# Patient Record
Sex: Female | Born: 1960 | Race: Black or African American | Hispanic: No | Smoking: Never smoker
Health system: Southern US, Community
[De-identification: ages and names within clinical notes are randomized; demographics above are authoritative.]

## PROBLEM LIST (undated history)

## (undated) DIAGNOSIS — L309 Dermatitis, unspecified: Secondary | ICD-10-CM

## (undated) DIAGNOSIS — K429 Umbilical hernia without obstruction or gangrene: Secondary | ICD-10-CM

## (undated) DIAGNOSIS — Z853 Personal history of malignant neoplasm of breast: Secondary | ICD-10-CM

## (undated) DIAGNOSIS — K439 Ventral hernia without obstruction or gangrene: Secondary | ICD-10-CM

## (undated) DIAGNOSIS — F419 Anxiety disorder, unspecified: Secondary | ICD-10-CM

## (undated) DIAGNOSIS — M1990 Unspecified osteoarthritis, unspecified site: Secondary | ICD-10-CM

## (undated) DIAGNOSIS — D869 Sarcoidosis, unspecified: Secondary | ICD-10-CM

## (undated) HISTORY — PX: PORTACATH PLACEMENT: SHX2246

## (undated) HISTORY — DX: Personal history of malignant neoplasm of breast: Z85.3

## (undated) HISTORY — DX: Umbilical hernia without obstruction or gangrene: K42.9

## (undated) HISTORY — DX: Ventral hernia without obstruction or gangrene: K43.9

---

## 1997-09-23 ENCOUNTER — Encounter: Admission: RE | Admit: 1997-09-23 | Discharge: 1997-09-23 | Payer: Self-pay | Admitting: Family Medicine

## 1997-10-06 ENCOUNTER — Encounter: Admission: RE | Admit: 1997-10-06 | Discharge: 1997-10-06 | Payer: Self-pay | Admitting: Family Medicine

## 1997-10-21 ENCOUNTER — Encounter: Admission: RE | Admit: 1997-10-21 | Discharge: 1997-10-21 | Payer: Self-pay | Admitting: Family Medicine

## 1997-11-21 ENCOUNTER — Ambulatory Visit (HOSPITAL_COMMUNITY): Admission: RE | Admit: 1997-11-21 | Discharge: 1997-11-21 | Payer: Self-pay

## 1997-11-21 ENCOUNTER — Encounter: Admission: RE | Admit: 1997-11-21 | Discharge: 1997-11-21 | Payer: Self-pay | Admitting: Family Medicine

## 1997-12-07 ENCOUNTER — Encounter: Admission: RE | Admit: 1997-12-07 | Discharge: 1997-12-07 | Payer: Self-pay | Admitting: Family Medicine

## 1998-01-11 ENCOUNTER — Encounter: Admission: RE | Admit: 1998-01-11 | Discharge: 1998-01-11 | Payer: Self-pay | Admitting: Family Medicine

## 1998-01-27 ENCOUNTER — Encounter: Admission: RE | Admit: 1998-01-27 | Discharge: 1998-01-27 | Payer: Self-pay | Admitting: Family Medicine

## 1998-01-27 ENCOUNTER — Ambulatory Visit (HOSPITAL_COMMUNITY): Admission: RE | Admit: 1998-01-27 | Discharge: 1998-01-27 | Payer: Self-pay

## 1998-02-17 ENCOUNTER — Encounter: Admission: RE | Admit: 1998-02-17 | Discharge: 1998-02-17 | Payer: Self-pay | Admitting: Family Medicine

## 1998-03-07 ENCOUNTER — Encounter: Admission: RE | Admit: 1998-03-07 | Discharge: 1998-03-07 | Payer: Self-pay | Admitting: Family Medicine

## 1998-03-17 ENCOUNTER — Encounter: Admission: RE | Admit: 1998-03-17 | Discharge: 1998-03-17 | Payer: Self-pay | Admitting: Family Medicine

## 1998-03-21 ENCOUNTER — Encounter: Admission: RE | Admit: 1998-03-21 | Discharge: 1998-03-21 | Payer: Self-pay | Admitting: Sports Medicine

## 1998-04-18 ENCOUNTER — Encounter: Admission: RE | Admit: 1998-04-18 | Discharge: 1998-04-18 | Payer: Self-pay | Admitting: Sports Medicine

## 1998-05-03 ENCOUNTER — Encounter: Admission: RE | Admit: 1998-05-03 | Discharge: 1998-05-03 | Payer: Self-pay | Admitting: Family Medicine

## 1998-05-09 ENCOUNTER — Encounter: Admission: RE | Admit: 1998-05-09 | Discharge: 1998-05-09 | Payer: Self-pay | Admitting: Sports Medicine

## 1998-05-12 ENCOUNTER — Inpatient Hospital Stay (HOSPITAL_COMMUNITY): Admission: AD | Admit: 1998-05-12 | Discharge: 1998-05-12 | Payer: Self-pay | Admitting: *Deleted

## 1998-05-12 ENCOUNTER — Encounter: Admission: RE | Admit: 1998-05-12 | Discharge: 1998-05-12 | Payer: Self-pay | Admitting: Sports Medicine

## 1998-05-16 ENCOUNTER — Inpatient Hospital Stay (HOSPITAL_COMMUNITY): Admission: AD | Admit: 1998-05-16 | Discharge: 1998-05-16 | Payer: Self-pay | Admitting: *Deleted

## 1998-05-18 ENCOUNTER — Inpatient Hospital Stay (HOSPITAL_COMMUNITY): Admission: AD | Admit: 1998-05-18 | Discharge: 1998-05-21 | Payer: Self-pay | Admitting: Obstetrics & Gynecology

## 1998-05-19 ENCOUNTER — Encounter: Admission: RE | Admit: 1998-05-19 | Discharge: 1998-05-19 | Payer: Self-pay | Admitting: Family Medicine

## 1998-05-21 ENCOUNTER — Encounter (HOSPITAL_COMMUNITY): Admission: RE | Admit: 1998-05-21 | Discharge: 1998-08-19 | Payer: Self-pay | Admitting: *Deleted

## 1998-05-25 ENCOUNTER — Inpatient Hospital Stay (HOSPITAL_COMMUNITY): Admission: AD | Admit: 1998-05-25 | Discharge: 1998-05-25 | Payer: Self-pay | Admitting: Obstetrics & Gynecology

## 1998-05-28 ENCOUNTER — Inpatient Hospital Stay (HOSPITAL_COMMUNITY): Admission: AD | Admit: 1998-05-28 | Discharge: 1998-06-01 | Payer: Self-pay | Admitting: Obstetrics & Gynecology

## 1998-06-06 ENCOUNTER — Encounter: Admission: RE | Admit: 1998-06-06 | Discharge: 1998-06-06 | Payer: Self-pay | Admitting: Obstetrics & Gynecology

## 1998-07-11 ENCOUNTER — Encounter: Admission: RE | Admit: 1998-07-11 | Discharge: 1998-07-11 | Payer: Self-pay | Admitting: Sports Medicine

## 1998-07-11 ENCOUNTER — Other Ambulatory Visit: Admission: RE | Admit: 1998-07-11 | Discharge: 1998-07-11 | Payer: Self-pay

## 1999-03-08 ENCOUNTER — Encounter: Admission: RE | Admit: 1999-03-08 | Discharge: 1999-03-08 | Payer: Self-pay | Admitting: Family Medicine

## 1999-11-06 ENCOUNTER — Ambulatory Visit (HOSPITAL_COMMUNITY): Admission: RE | Admit: 1999-11-06 | Discharge: 1999-11-06 | Payer: Self-pay | Admitting: Sports Medicine

## 1999-11-06 ENCOUNTER — Encounter: Payer: Self-pay | Admitting: Sports Medicine

## 2000-06-25 ENCOUNTER — Encounter: Admission: RE | Admit: 2000-06-25 | Discharge: 2000-06-25 | Payer: Self-pay | Admitting: Family Medicine

## 2000-10-25 ENCOUNTER — Emergency Department (HOSPITAL_COMMUNITY): Admission: EM | Admit: 2000-10-25 | Discharge: 2000-10-25 | Payer: Self-pay

## 2000-11-14 ENCOUNTER — Encounter: Admission: RE | Admit: 2000-11-14 | Discharge: 2000-11-14 | Payer: Self-pay | Admitting: Family Medicine

## 2001-02-21 ENCOUNTER — Emergency Department (HOSPITAL_COMMUNITY): Admission: EM | Admit: 2001-02-21 | Discharge: 2001-02-21 | Payer: Self-pay | Admitting: Emergency Medicine

## 2001-03-16 ENCOUNTER — Encounter: Admission: RE | Admit: 2001-03-16 | Discharge: 2001-03-16 | Payer: Self-pay | Admitting: Family Medicine

## 2001-04-16 ENCOUNTER — Encounter: Admission: RE | Admit: 2001-04-16 | Discharge: 2001-04-16 | Payer: Self-pay | Admitting: Family Medicine

## 2001-04-16 ENCOUNTER — Other Ambulatory Visit: Admission: RE | Admit: 2001-04-16 | Discharge: 2001-04-16 | Payer: Self-pay | Admitting: Family Medicine

## 2001-04-16 ENCOUNTER — Encounter (INDEPENDENT_AMBULATORY_CARE_PROVIDER_SITE_OTHER): Payer: Self-pay

## 2001-11-05 ENCOUNTER — Encounter: Admission: RE | Admit: 2001-11-05 | Discharge: 2001-11-05 | Payer: Self-pay | Admitting: Family Medicine

## 2002-02-18 ENCOUNTER — Encounter: Admission: RE | Admit: 2002-02-18 | Discharge: 2002-02-18 | Payer: Self-pay | Admitting: Family Medicine

## 2002-04-30 ENCOUNTER — Emergency Department (HOSPITAL_COMMUNITY): Admission: EM | Admit: 2002-04-30 | Discharge: 2002-04-30 | Payer: Self-pay | Admitting: Emergency Medicine

## 2002-04-30 ENCOUNTER — Encounter: Payer: Self-pay | Admitting: Emergency Medicine

## 2002-05-03 ENCOUNTER — Encounter: Admission: RE | Admit: 2002-05-03 | Discharge: 2002-05-03 | Payer: Self-pay | Admitting: Family Medicine

## 2002-05-20 ENCOUNTER — Encounter: Admission: RE | Admit: 2002-05-20 | Discharge: 2002-05-20 | Payer: Self-pay | Admitting: Family Medicine

## 2003-03-03 ENCOUNTER — Encounter: Admission: RE | Admit: 2003-03-03 | Discharge: 2003-03-03 | Payer: Self-pay | Admitting: Family Medicine

## 2003-03-22 ENCOUNTER — Ambulatory Visit (HOSPITAL_COMMUNITY): Admission: RE | Admit: 2003-03-22 | Discharge: 2003-03-22 | Payer: Self-pay | Admitting: Family Medicine

## 2003-03-29 ENCOUNTER — Encounter: Admission: RE | Admit: 2003-03-29 | Discharge: 2003-03-29 | Payer: Self-pay | Admitting: Sports Medicine

## 2003-05-30 ENCOUNTER — Emergency Department (HOSPITAL_COMMUNITY): Admission: AD | Admit: 2003-05-30 | Discharge: 2003-05-30 | Payer: Self-pay | Admitting: Family Medicine

## 2003-08-27 ENCOUNTER — Emergency Department (HOSPITAL_COMMUNITY): Admission: EM | Admit: 2003-08-27 | Discharge: 2003-08-27 | Payer: Self-pay | Admitting: Family Medicine

## 2003-09-22 ENCOUNTER — Other Ambulatory Visit: Admission: RE | Admit: 2003-09-22 | Discharge: 2003-09-22 | Payer: Self-pay | Admitting: Family Medicine

## 2003-09-22 ENCOUNTER — Encounter: Admission: RE | Admit: 2003-09-22 | Discharge: 2003-09-22 | Payer: Self-pay | Admitting: Family Medicine

## 2003-10-25 ENCOUNTER — Encounter: Admission: RE | Admit: 2003-10-25 | Discharge: 2003-10-25 | Payer: Self-pay | Admitting: Family Medicine

## 2003-11-29 ENCOUNTER — Encounter (INDEPENDENT_AMBULATORY_CARE_PROVIDER_SITE_OTHER): Payer: Self-pay | Admitting: *Deleted

## 2003-11-29 ENCOUNTER — Encounter: Admission: RE | Admit: 2003-11-29 | Discharge: 2003-11-29 | Payer: Self-pay | Admitting: Family Medicine

## 2003-11-29 ENCOUNTER — Encounter (INDEPENDENT_AMBULATORY_CARE_PROVIDER_SITE_OTHER): Payer: Self-pay | Admitting: Diagnostic Radiology

## 2003-11-30 ENCOUNTER — Encounter: Admission: RE | Admit: 2003-11-30 | Discharge: 2003-11-30 | Payer: Self-pay | Admitting: Family Medicine

## 2003-12-27 ENCOUNTER — Encounter: Admission: RE | Admit: 2003-12-27 | Discharge: 2003-12-27 | Payer: Self-pay | Admitting: General Surgery

## 2004-01-25 ENCOUNTER — Encounter: Admission: RE | Admit: 2004-01-25 | Discharge: 2004-01-25 | Payer: Self-pay | Admitting: General Surgery

## 2004-01-26 ENCOUNTER — Ambulatory Visit (HOSPITAL_COMMUNITY): Admission: RE | Admit: 2004-01-26 | Discharge: 2004-01-26 | Payer: Self-pay | Admitting: General Surgery

## 2004-01-31 ENCOUNTER — Ambulatory Visit (HOSPITAL_COMMUNITY): Admission: RE | Admit: 2004-01-31 | Discharge: 2004-01-31 | Payer: Self-pay | Admitting: General Surgery

## 2004-01-31 ENCOUNTER — Ambulatory Visit (HOSPITAL_BASED_OUTPATIENT_CLINIC_OR_DEPARTMENT_OTHER): Admission: RE | Admit: 2004-01-31 | Discharge: 2004-01-31 | Payer: Self-pay | Admitting: General Surgery

## 2004-02-01 ENCOUNTER — Ambulatory Visit (HOSPITAL_COMMUNITY): Admission: RE | Admit: 2004-02-01 | Discharge: 2004-02-01 | Payer: Self-pay | Admitting: Oncology

## 2004-02-13 ENCOUNTER — Ambulatory Visit (HOSPITAL_COMMUNITY): Admission: RE | Admit: 2004-02-13 | Discharge: 2004-02-13 | Payer: Self-pay | Admitting: General Surgery

## 2004-02-18 ENCOUNTER — Emergency Department (HOSPITAL_COMMUNITY): Admission: EM | Admit: 2004-02-18 | Discharge: 2004-02-18 | Payer: Self-pay | Admitting: Emergency Medicine

## 2004-03-09 ENCOUNTER — Ambulatory Visit: Payer: Self-pay | Admitting: Oncology

## 2004-04-09 ENCOUNTER — Encounter: Admission: RE | Admit: 2004-04-09 | Discharge: 2004-04-09 | Payer: Self-pay | Admitting: Oncology

## 2004-05-04 ENCOUNTER — Ambulatory Visit: Payer: Self-pay | Admitting: Oncology

## 2004-06-18 ENCOUNTER — Encounter: Admission: RE | Admit: 2004-06-18 | Discharge: 2004-06-18 | Payer: Self-pay | Admitting: General Surgery

## 2004-06-21 ENCOUNTER — Ambulatory Visit: Payer: Self-pay | Admitting: Oncology

## 2004-08-21 ENCOUNTER — Ambulatory Visit: Payer: Self-pay | Admitting: Oncology

## 2004-09-13 ENCOUNTER — Other Ambulatory Visit: Admission: RE | Admit: 2004-09-13 | Discharge: 2004-09-13 | Payer: Self-pay | Admitting: Gynecology

## 2005-02-19 ENCOUNTER — Encounter: Admission: RE | Admit: 2005-02-19 | Discharge: 2005-02-19 | Payer: Self-pay | Admitting: Oncology

## 2005-06-26 IMAGING — CT CT ABDOMEN W/ CM
1 of 4 series · 13 of 32 positions shown, 18 images · IV contrast (agent unspecified)
Comparison: none

Clinical: Breast carcinoma

CT HEAD WITHOUT AND WITH CONTRAST

[Series 2: cap w/iv 5.0 b30f · axial · 0.74mm/px · z∈[-610,-90]mm · 13 of 118 slices shown, 18 images]
[im 7/118  soft-tissue]
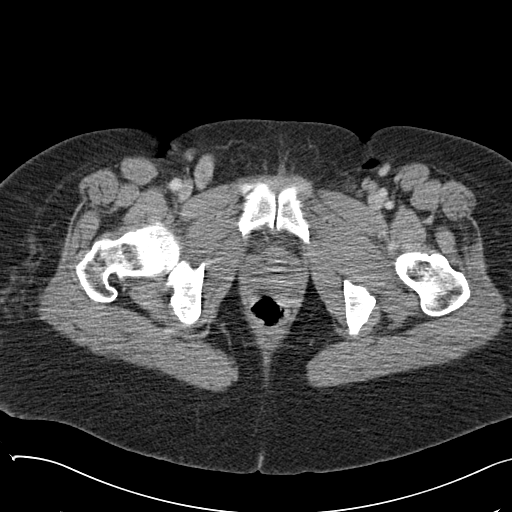
[im 7/118  bone]
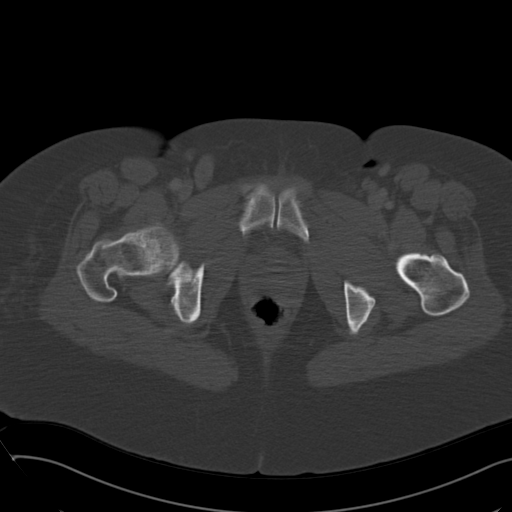
[im 21/118  soft-tissue]
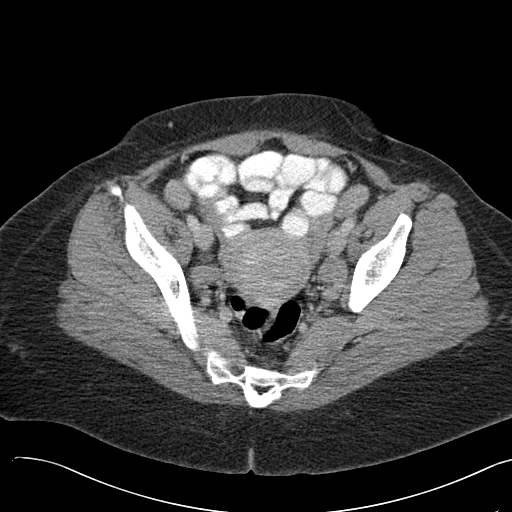
[im 28/118  soft-tissue]
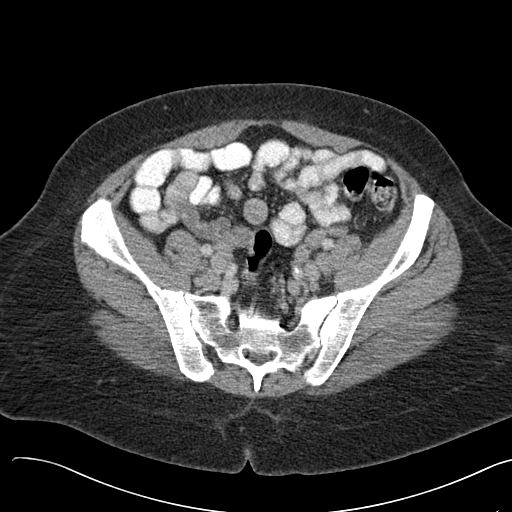
[im 35/118  soft-tissue]
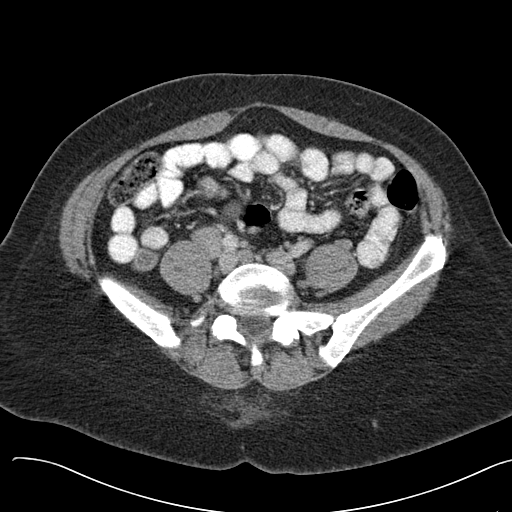
[im 49/118  soft-tissue]
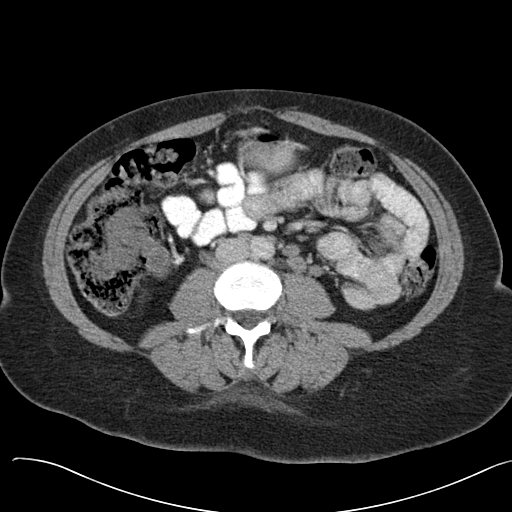
[im 56/118  soft-tissue]
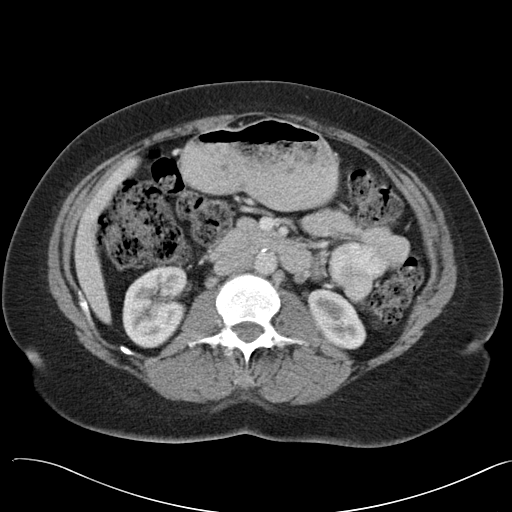
[im 62/118  soft-tissue]
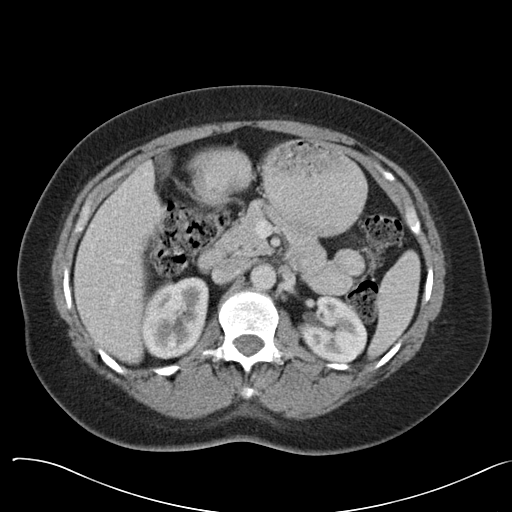
[im 76/118  soft-tissue]
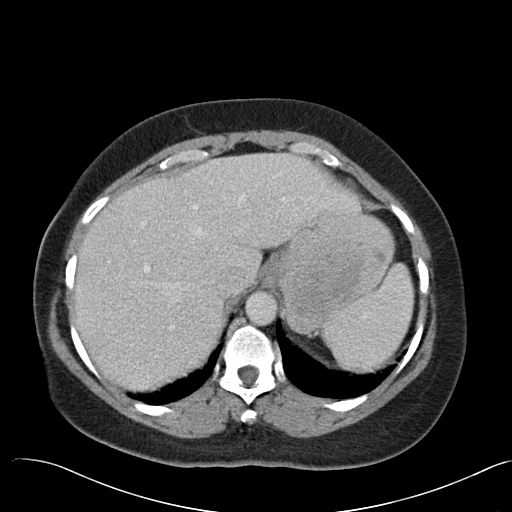
[im 83/118  soft-tissue]
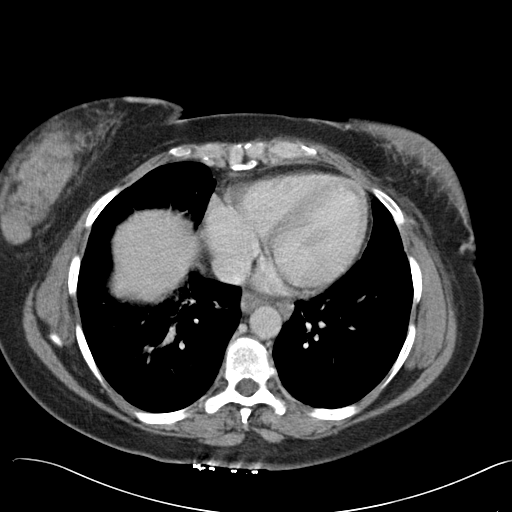
[im 83/118  bone]
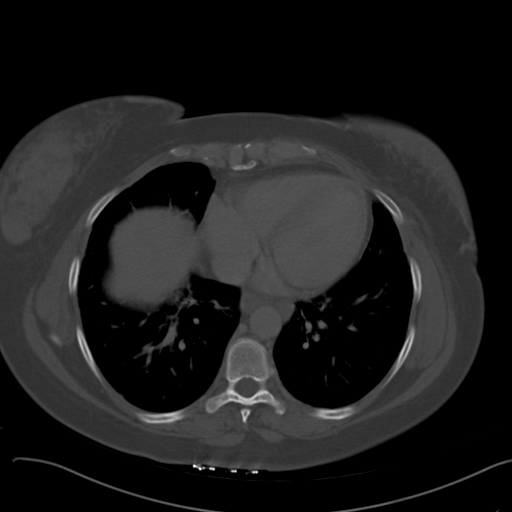
[im 90/118  soft-tissue]
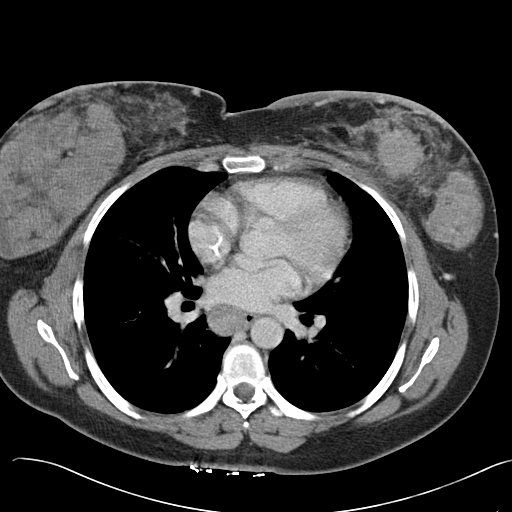
[im 90/118  lung]
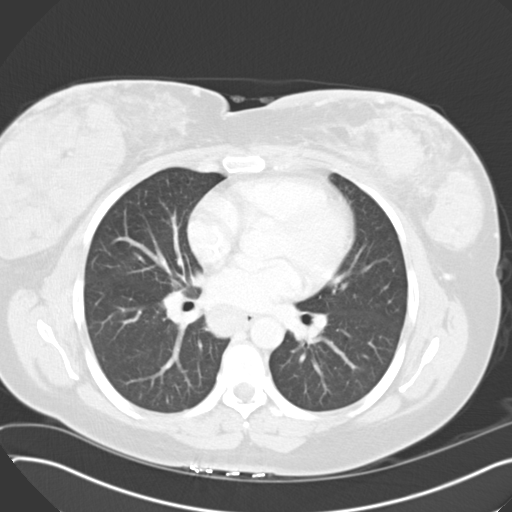
[im 97/118  lung]
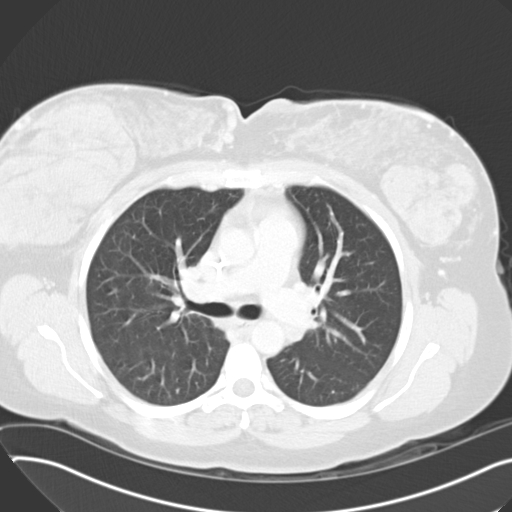
[im 104/118  soft-tissue]
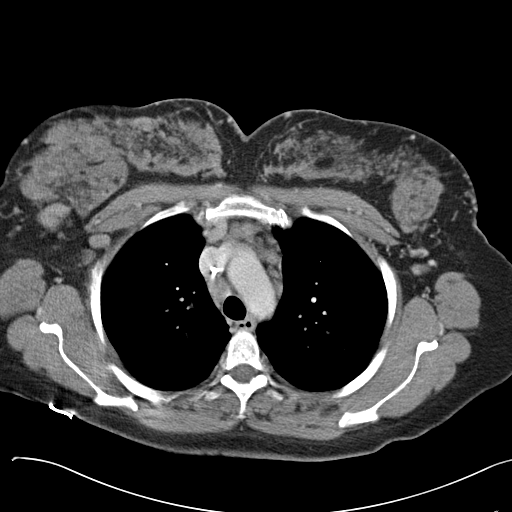
[im 104/118  lung]
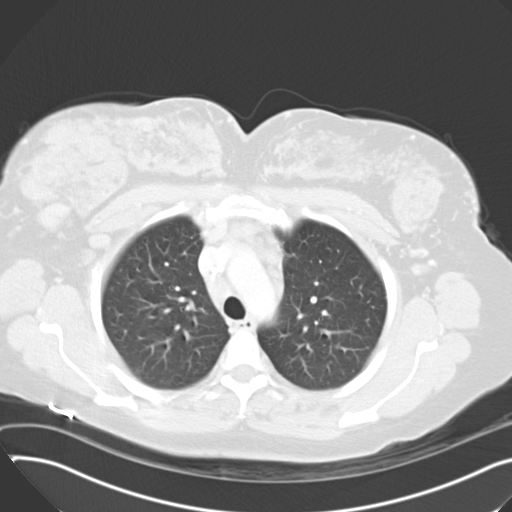
[im 111/118  soft-tissue]
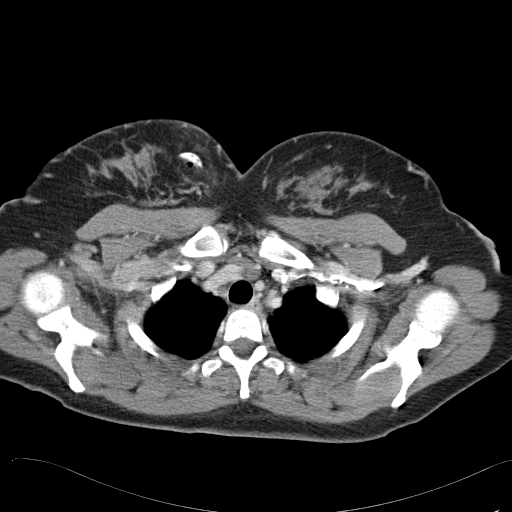
[im 111/118  lung]
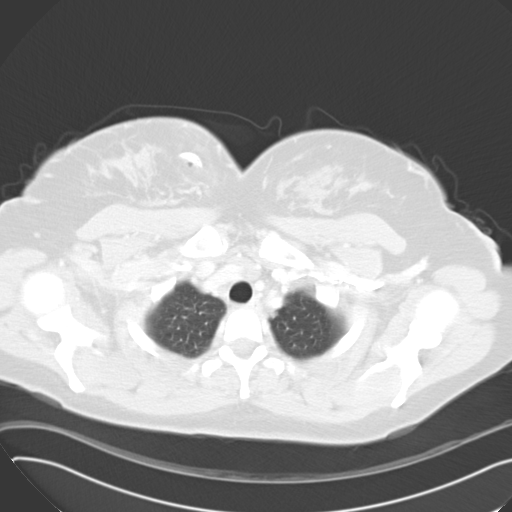

[13 of 32 positions shown; findings below may reference images not displayed]

FINDINGS: Axial scanning before and after 150 ml Imnipaque-KKK IV. No previous for comparison.
Negative for acute intracranial hemorrhage, midline shift, focal parenchymal edema, or mass-effect.
Physiologic calcifications are noted in the basal ganglia. No areas of unexpected enhancement after
IV contrast administration. Bone windows to Mr. no focal lesion.
IMPRESSION: Unremarkable CT head.

CT CHEST WITH CONTRAST
FINDINGS: Right subclavian port catheter extends to the low SVC. There are enlarged axillary lymph
nodes bilaterally right greater than left. Enlarged prevascular, precarinal, pretracheal, and
subcarinal lymph nodes. Largest node is in the subcarinal region measuring 3 cm in short axis
diameter. There is borderline hilar adenopathy bilaterally. No pleural or pericardial effusion.
Lung windows demonstrate a 5 mm noncalcified nonspecific lesion in the superior segment left lower
lobe image 25.
IMPRESSION: 1. Bulky mediastinal and borderline bilateral hilar lymphadenopathy as well as bilateral axillary
adenopathy right greater than left.
2. Nonspecific 5 mm nodule in superior segment left lower lobe.

CT ABDOMEN WITH CONTRAST
FINDINGS: Unremarkable liver, gallbladder, spleen, adrenal glands, pancreas, kidneys. Small bowel
decompressed. No free air. No ascites. Left periaortic adenopathy, largest node measuring 11 mm in
short axis diameter image 67.
IMPRESSION: Left periaortic adenopathy

CT PELVIS WITH CONTRAST
FINDINGS: Normal appendix. Moderate amount of stool throughout the colon, which is nondilated. No
free fluid. Urinary bladder incompletely distended. Uterus and adnexal regions unremarkable. Bulky
bilateral external iliac lymphadenopathy, and bilateral common iliac chain lymphadenopathy. There
is a poorly marginated  nonspecific sclerotic focus in the left sacral ala.
IMPRESSION: 1. Bulky bilateral iliac lymphadenopathy.
2. Nonspecific sclerotic focus in the left sacral ala. This is thought to likely be benign as there
is no evidence of abnormal abnormal activity on recent bone scintigraphy.

## 2005-07-13 IMAGING — CR DG CHEST 2V
2 series · 2 of 2 positions shown · non-contrast
Comparison: none

CLINICAL DATA: Chest pain/fever/breast ca.
 TWO VIEW CHEST 
 Two view chest with comparison 01/25/04.  
 Heart normal.  Still some fullness in the mediastinum consistent with adenopathy although it may be slightly improved.  No definite air space disease or pleural fluid.  Port-a-cath unchanged. 
 IMPRESSION
 1.  Mediastinal adenopathy ? perhaps slightly improved.
 2.  No definite air space disease or pleural fluid.

[view not recorded (1 of 2)]
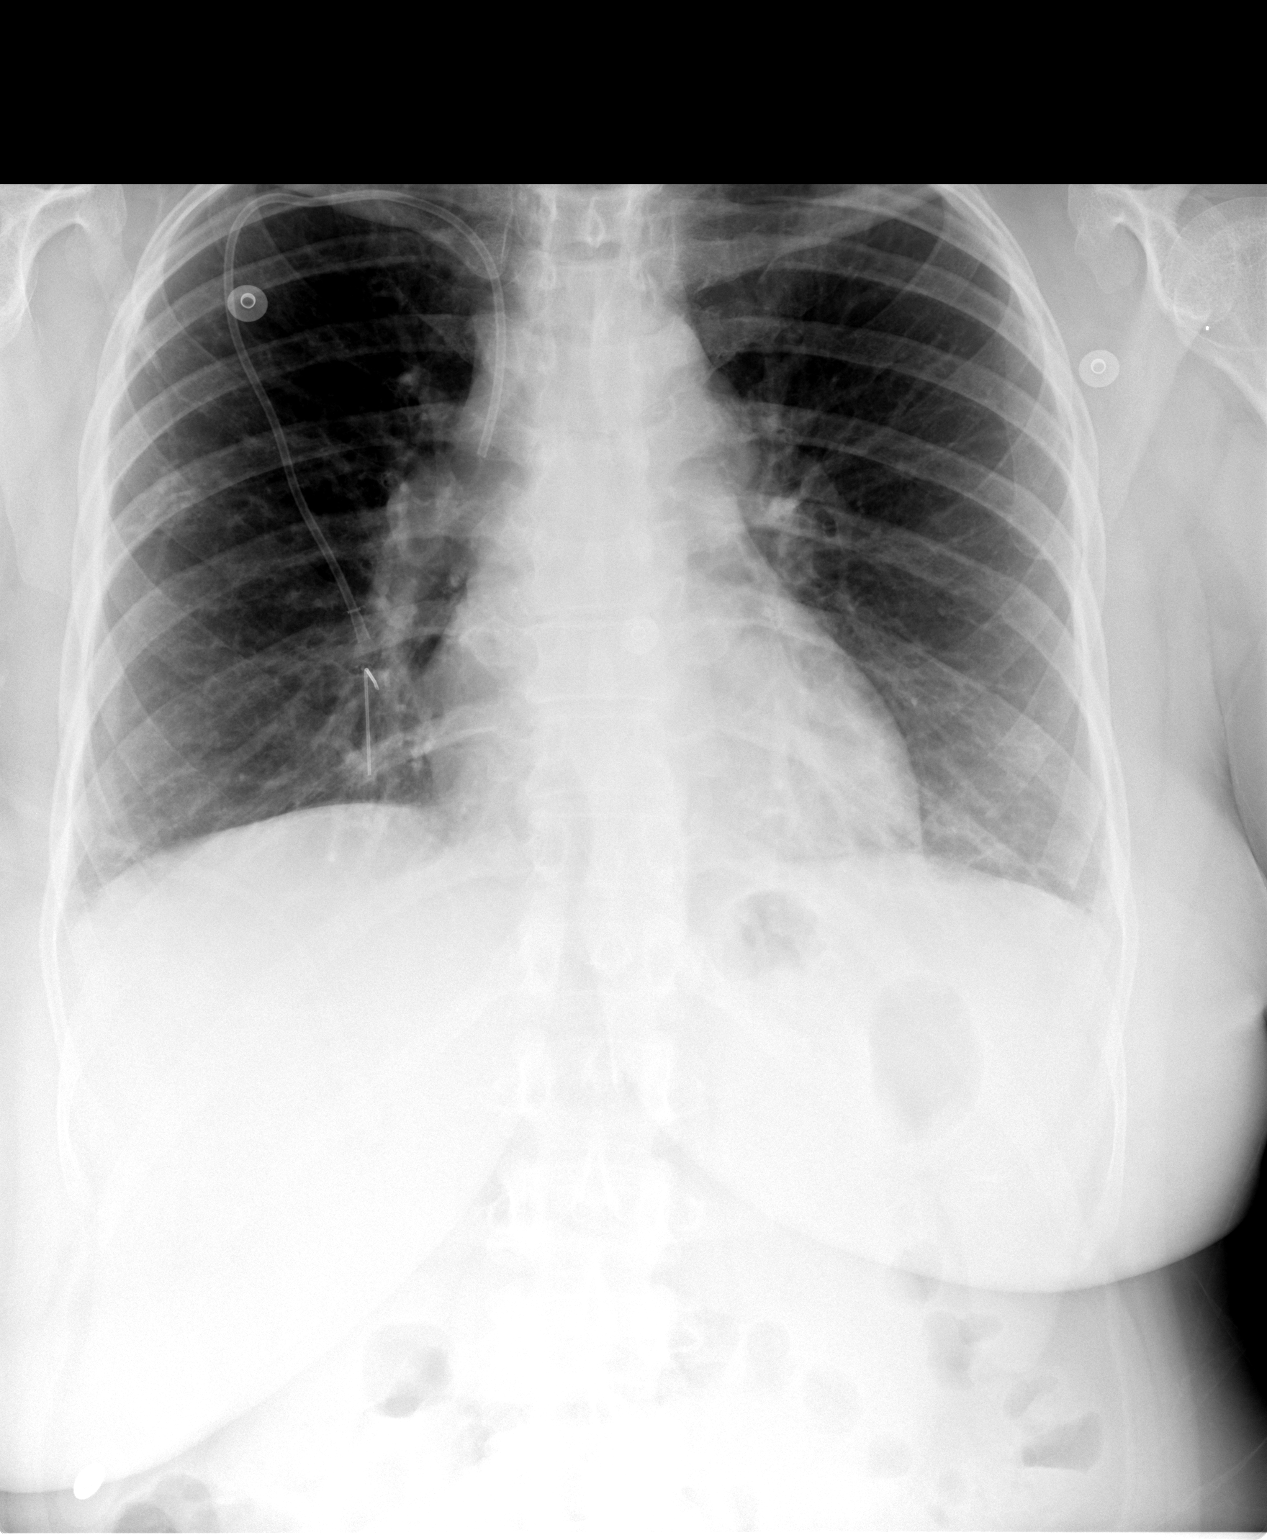

[view not recorded (2 of 2)]
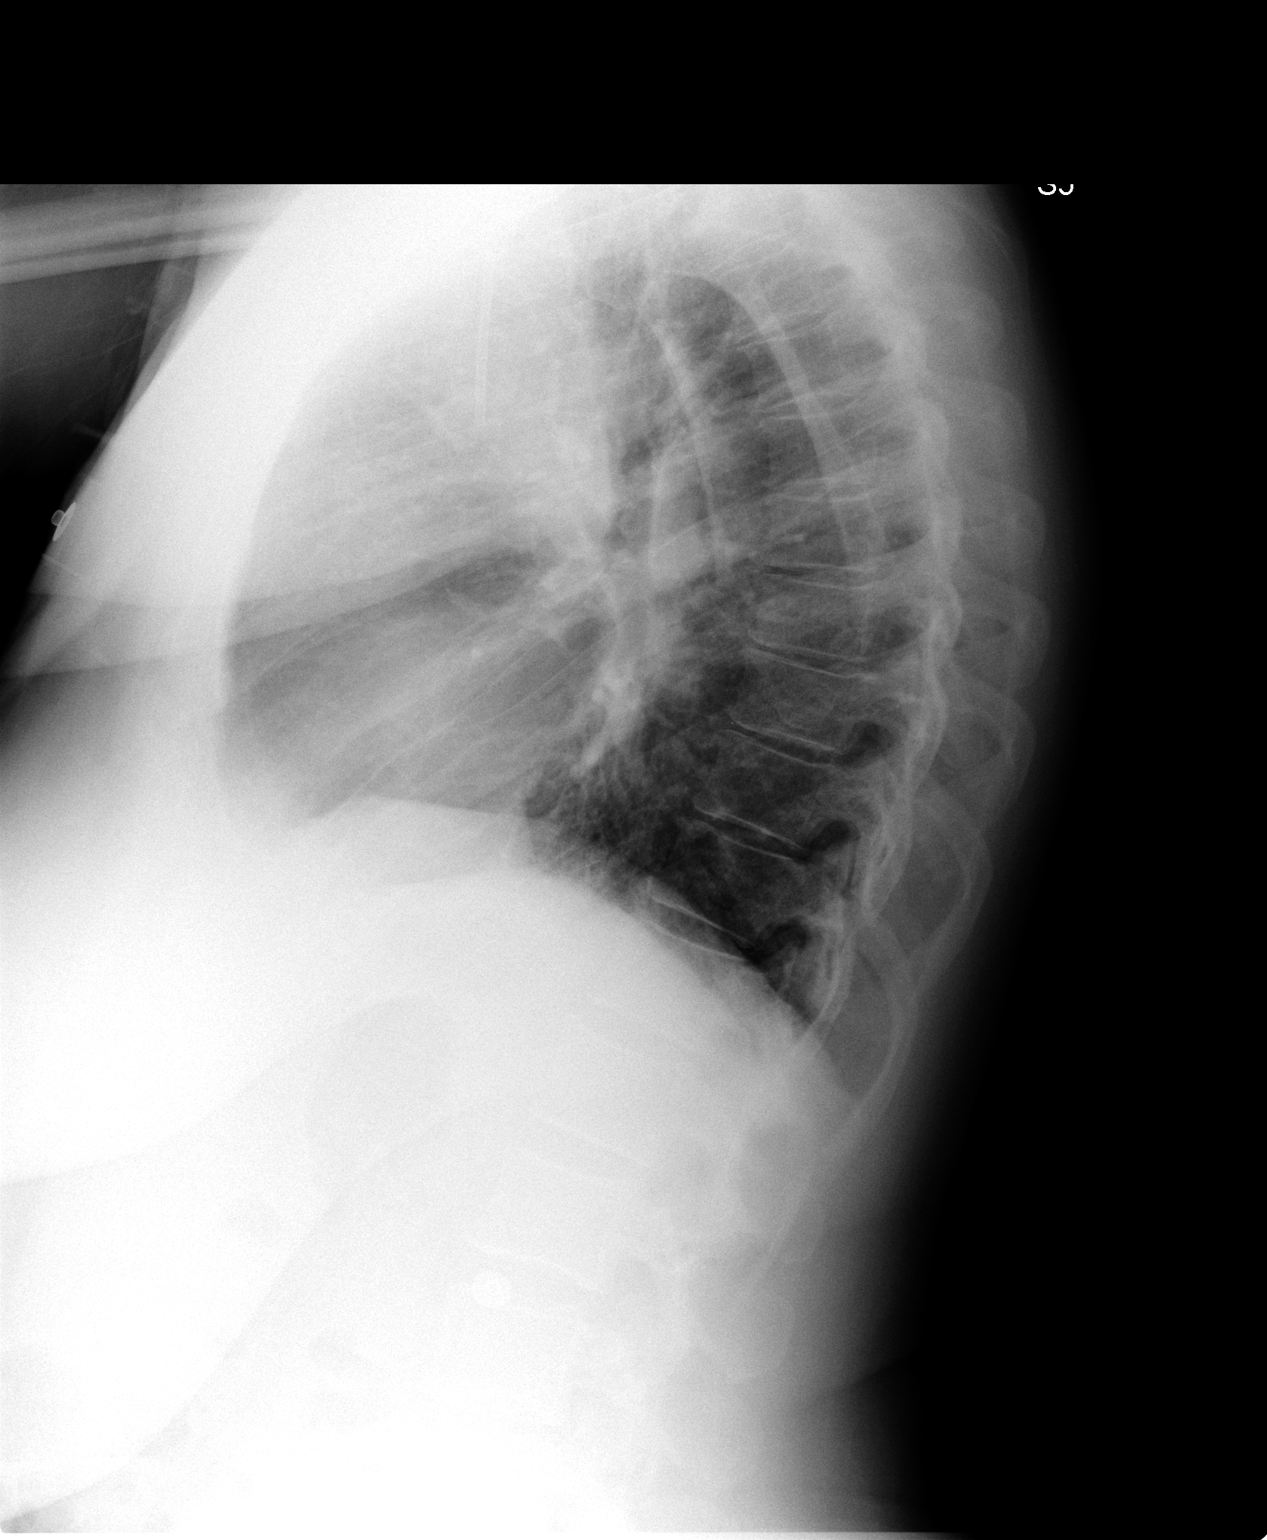

[2 of 2 positions shown; findings below may reference images not displayed]

## 2005-08-30 ENCOUNTER — Ambulatory Visit: Payer: Self-pay | Admitting: Oncology

## 2005-09-02 LAB — COMPREHENSIVE METABOLIC PANEL
ALT: 8 U/L (ref 0–40)
Alkaline Phosphatase: 90 U/L (ref 39–117)
CO2: 29 mEq/L (ref 19–32)
Potassium: 4.3 mEq/L (ref 3.5–5.3)
Sodium: 141 mEq/L (ref 135–145)
Total Bilirubin: 0.6 mg/dL (ref 0.3–1.2)
Total Protein: 7.4 g/dL (ref 6.0–8.3)

## 2005-09-02 LAB — CBC WITH DIFFERENTIAL/PLATELET
BASO%: 0.2 % (ref 0.0–2.0)
LYMPH%: 18.4 % (ref 14.0–48.0)
MCHC: 33.6 g/dL (ref 32.0–36.0)
MONO#: 0.7 10*3/uL (ref 0.1–0.9)
MONO%: 9.8 % (ref 0.0–13.0)
Platelets: 260 10*3/uL (ref 145–400)
RBC: 5.91 10*6/uL — ABNORMAL HIGH (ref 3.70–5.32)
RDW: 15.4 % — ABNORMAL HIGH (ref 11.3–14.5)
WBC: 6.9 10*3/uL (ref 3.9–10.0)

## 2005-09-02 LAB — CANCER ANTIGEN 27.29: CA 27.29: 48 U/mL — ABNORMAL HIGH (ref 0–39)

## 2005-09-12 ENCOUNTER — Emergency Department (HOSPITAL_COMMUNITY): Admission: EM | Admit: 2005-09-12 | Discharge: 2005-09-12 | Payer: Self-pay | Admitting: Family Medicine

## 2005-10-29 ENCOUNTER — Ambulatory Visit: Payer: Self-pay | Admitting: Oncology

## 2005-12-05 ENCOUNTER — Ambulatory Visit: Payer: Self-pay | Admitting: Oncology

## 2005-12-05 LAB — CBC WITH DIFFERENTIAL/PLATELET
BASO%: 0.4 % (ref 0.0–2.0)
Basophils Absolute: 0 10*3/uL (ref 0.0–0.1)
EOS%: 6.4 % (ref 0.0–7.0)
HCT: 39.6 % (ref 34.8–46.6)
MCH: 24.9 pg — ABNORMAL LOW (ref 26.0–34.0)
MCHC: 33.5 g/dL (ref 32.0–36.0)
MCV: 74.2 fL — ABNORMAL LOW (ref 81.0–101.0)
MONO%: 11.8 % (ref 0.0–13.0)
NEUT%: 63.1 % (ref 39.6–76.8)
lymph#: 1 10*3/uL (ref 0.9–3.3)

## 2005-12-05 LAB — COMPREHENSIVE METABOLIC PANEL
Albumin: 3.9 g/dL (ref 3.5–5.2)
Alkaline Phosphatase: 96 U/L (ref 39–117)
BUN: 14 mg/dL (ref 6–23)
CO2: 29 mEq/L (ref 19–32)
Calcium: 9.1 mg/dL (ref 8.4–10.5)
Chloride: 103 mEq/L (ref 96–112)
Glucose, Bld: 110 mg/dL — ABNORMAL HIGH (ref 70–99)
Potassium: 4.3 mEq/L (ref 3.5–5.3)
Sodium: 141 mEq/L (ref 135–145)
Total Protein: 7.1 g/dL (ref 6.0–8.3)

## 2006-03-04 ENCOUNTER — Ambulatory Visit: Payer: Self-pay | Admitting: Oncology

## 2006-05-13 ENCOUNTER — Ambulatory Visit: Payer: Self-pay | Admitting: Oncology

## 2006-05-16 LAB — COMPREHENSIVE METABOLIC PANEL
ALT: 12 U/L (ref 0–35)
CO2: 26 mEq/L (ref 19–32)
Chloride: 105 mEq/L (ref 96–112)
Potassium: 3.5 mEq/L (ref 3.5–5.3)
Sodium: 138 mEq/L (ref 135–145)
Total Bilirubin: 0.7 mg/dL (ref 0.3–1.2)
Total Protein: 7.5 g/dL (ref 6.0–8.3)

## 2006-05-16 LAB — CBC WITH DIFFERENTIAL/PLATELET
BASO%: 0.3 % (ref 0.0–2.0)
MCHC: 32.5 g/dL (ref 32.0–36.0)
MONO#: 0.7 10*3/uL (ref 0.1–0.9)
RBC: 5.81 10*6/uL — ABNORMAL HIGH (ref 3.70–5.32)
RDW: 15.5 % — ABNORMAL HIGH (ref 11.3–14.5)
WBC: 8 10*3/uL (ref 3.9–10.0)
lymph#: 1.2 10*3/uL (ref 0.9–3.3)

## 2006-05-16 LAB — LACTATE DEHYDROGENASE: LDH: 241 U/L (ref 94–250)

## 2006-06-10 ENCOUNTER — Ambulatory Visit (HOSPITAL_BASED_OUTPATIENT_CLINIC_OR_DEPARTMENT_OTHER): Admission: RE | Admit: 2006-06-10 | Discharge: 2006-06-10 | Payer: Self-pay | Admitting: General Surgery

## 2006-07-01 ENCOUNTER — Ambulatory Visit: Payer: Self-pay | Admitting: Oncology

## 2006-07-03 DIAGNOSIS — L2089 Other atopic dermatitis: Secondary | ICD-10-CM

## 2006-07-03 DIAGNOSIS — F410 Panic disorder [episodic paroxysmal anxiety] without agoraphobia: Secondary | ICD-10-CM

## 2006-10-05 ENCOUNTER — Emergency Department (HOSPITAL_COMMUNITY): Admission: EM | Admit: 2006-10-05 | Discharge: 2006-10-05 | Payer: Self-pay | Admitting: Emergency Medicine

## 2006-10-19 ENCOUNTER — Encounter: Admission: RE | Admit: 2006-10-19 | Discharge: 2006-10-19 | Payer: Self-pay | Admitting: Neurosurgery

## 2006-10-22 ENCOUNTER — Ambulatory Visit: Payer: Self-pay | Admitting: Oncology

## 2007-01-16 ENCOUNTER — Encounter: Admission: RE | Admit: 2007-01-16 | Discharge: 2007-01-16 | Payer: Self-pay | Admitting: Oncology

## 2007-01-20 ENCOUNTER — Ambulatory Visit: Payer: Self-pay | Admitting: Oncology

## 2007-02-08 ENCOUNTER — Emergency Department (HOSPITAL_COMMUNITY): Admission: EM | Admit: 2007-02-08 | Discharge: 2007-02-08 | Payer: Self-pay | Admitting: Emergency Medicine

## 2007-02-14 ENCOUNTER — Emergency Department (HOSPITAL_COMMUNITY): Admission: EM | Admit: 2007-02-14 | Discharge: 2007-02-14 | Payer: Self-pay | Admitting: Emergency Medicine

## 2007-02-23 ENCOUNTER — Ambulatory Visit: Payer: Self-pay | Admitting: Oncology

## 2007-02-23 LAB — CEA: CEA: <0.5 ng/mL (ref 0.0–5.0)

## 2007-02-23 LAB — COMPREHENSIVE METABOLIC PANEL
ALT: 10 U/L (ref 0–35)
CO2: 27 mEq/L (ref 19–32)
Calcium: 9.2 mg/dL (ref 8.4–10.5)
Chloride: 104 mEq/L (ref 96–112)
Creatinine, Ser: 1.02 mg/dL (ref 0.40–1.20)
Glucose, Bld: 104 mg/dL — ABNORMAL HIGH (ref 70–99)

## 2007-02-23 LAB — CBC WITH DIFFERENTIAL/PLATELET
Basophils Absolute: 0 10*3/uL (ref 0.0–0.1)
EOS%: 7.8 % — ABNORMAL HIGH (ref 0.0–7.0)
HGB: 13.5 g/dL (ref 11.6–15.9)
MCH: 24.7 pg — ABNORMAL LOW (ref 26.0–34.0)
MCV: 72.7 fL — ABNORMAL LOW (ref 81.0–101.0)
MONO%: 9 % (ref 0.0–13.0)
NEUT%: 65 % (ref 39.6–76.8)
RDW: 15.7 % — ABNORMAL HIGH (ref 11.3–14.5)

## 2007-02-23 LAB — LACTATE DEHYDROGENASE: LDH: 199 U/L (ref 94–250)

## 2007-02-25 ENCOUNTER — Ambulatory Visit (HOSPITAL_COMMUNITY): Admission: RE | Admit: 2007-02-25 | Discharge: 2007-02-25 | Payer: Self-pay | Admitting: Oncology

## 2007-05-04 ENCOUNTER — Ambulatory Visit: Payer: Self-pay | Admitting: Oncology

## 2007-06-17 ENCOUNTER — Ambulatory Visit: Payer: Self-pay | Admitting: Oncology

## 2007-07-23 LAB — CBC WITH DIFFERENTIAL/PLATELET
BASO%: 0.2 % (ref 0.0–2.0)
EOS%: 4.1 % (ref 0.0–7.0)
MCHC: 33.8 g/dL (ref 32.0–36.0)
MONO#: 0.5 10*3/uL (ref 0.1–0.9)
RBC: 5.53 10*6/uL — ABNORMAL HIGH (ref 3.70–5.32)
RDW: 15.4 % — ABNORMAL HIGH (ref 11.3–14.5)
WBC: 7.1 10*3/uL (ref 3.9–10.0)
lymph#: 1.1 10*3/uL (ref 0.9–3.3)

## 2007-07-24 LAB — COMPREHENSIVE METABOLIC PANEL
ALT: 12 U/L (ref 0–35)
AST: 17 U/L (ref 0–37)
CO2: 25 mEq/L (ref 19–32)
Calcium: 8.6 mg/dL (ref 8.4–10.5)
Chloride: 107 mEq/L (ref 96–112)
Sodium: 140 mEq/L (ref 135–145)
Total Bilirubin: 0.5 mg/dL (ref 0.3–1.2)
Total Protein: 7.1 g/dL (ref 6.0–8.3)

## 2007-07-24 LAB — CANCER ANTIGEN 27.29: CA 27.29: 38 U/mL (ref 0–39)

## 2007-07-24 LAB — LACTATE DEHYDROGENASE: LDH: 184 U/L (ref 94–250)

## 2007-07-24 LAB — CEA: CEA: <0.5 ng/mL (ref 0.0–5.0)

## 2007-07-24 LAB — VITAMIN D 25 HYDROXY (VIT D DEFICIENCY, FRACTURES): Vit D, 25-Hydroxy: 10 ng/mL — ABNORMAL LOW (ref 30–89)

## 2007-07-28 LAB — VITAMIN D 1,25 DIHYDROXY: Vit D, 1,25-Dihydroxy: 53 pg/mL (ref 6–62)

## 2007-08-07 ENCOUNTER — Encounter: Admission: RE | Admit: 2007-08-07 | Discharge: 2007-08-07 | Payer: Self-pay | Admitting: Oncology

## 2007-11-17 ENCOUNTER — Ambulatory Visit: Payer: Self-pay | Admitting: Oncology

## 2008-02-19 ENCOUNTER — Ambulatory Visit: Payer: Self-pay | Admitting: Oncology

## 2008-03-09 LAB — CBC WITH DIFFERENTIAL/PLATELET
Basophils Absolute: 0 10*3/uL (ref 0.0–0.1)
EOS%: 7.9 % — ABNORMAL HIGH (ref 0.0–7.0)
HCT: 42.5 % (ref 34.8–46.6)
HGB: 14.1 g/dL (ref 11.6–15.9)
MCH: 24.8 pg — ABNORMAL LOW (ref 26.0–34.0)
NEUT%: 66.7 % (ref 39.6–76.8)
lymph#: 1.1 10*3/uL (ref 0.9–3.3)

## 2008-03-10 LAB — COMPREHENSIVE METABOLIC PANEL
Albumin: 3.8 g/dL (ref 3.5–5.2)
Alkaline Phosphatase: 105 U/L (ref 39–117)
BUN: 19 mg/dL (ref 6–23)
CO2: 25 mEq/L (ref 19–32)
Calcium: 8.7 mg/dL (ref 8.4–10.5)
Chloride: 106 mEq/L (ref 96–112)
Glucose, Bld: 101 mg/dL — ABNORMAL HIGH (ref 70–99)
Potassium: 4.1 mEq/L (ref 3.5–5.3)

## 2008-03-10 LAB — LACTATE DEHYDROGENASE: LDH: 176 U/L (ref 94–250)

## 2008-03-10 LAB — VITAMIN D 25 HYDROXY (VIT D DEFICIENCY, FRACTURES): Vit D, 25-Hydroxy: 20 ng/mL — ABNORMAL LOW (ref 30–89)

## 2008-07-27 ENCOUNTER — Encounter: Admission: RE | Admit: 2008-07-27 | Discharge: 2008-07-27 | Payer: Self-pay | Admitting: Family Medicine

## 2008-08-08 ENCOUNTER — Encounter: Admission: RE | Admit: 2008-08-08 | Discharge: 2008-08-08 | Payer: Self-pay | Admitting: Oncology

## 2008-08-26 ENCOUNTER — Ambulatory Visit: Payer: Self-pay | Admitting: Oncology

## 2008-10-19 ENCOUNTER — Emergency Department (HOSPITAL_COMMUNITY): Admission: EM | Admit: 2008-10-19 | Discharge: 2008-10-19 | Payer: Self-pay | Admitting: Family Medicine

## 2009-03-04 ENCOUNTER — Emergency Department (HOSPITAL_COMMUNITY): Admission: EM | Admit: 2009-03-04 | Discharge: 2009-03-04 | Payer: Self-pay | Admitting: Emergency Medicine

## 2009-06-01 ENCOUNTER — Ambulatory Visit: Payer: Self-pay | Admitting: Oncology

## 2009-06-05 LAB — COMPREHENSIVE METABOLIC PANEL
ALT: 10 U/L (ref 0–35)
AST: 12 U/L (ref 0–37)
Albumin: 3.8 g/dL (ref 3.5–5.2)
Alkaline Phosphatase: 88 U/L (ref 39–117)
Potassium: 4.2 mEq/L (ref 3.5–5.3)
Sodium: 138 mEq/L (ref 135–145)
Total Protein: 7.1 g/dL (ref 6.0–8.3)

## 2009-06-05 LAB — CBC WITH DIFFERENTIAL/PLATELET
EOS%: 5 % (ref 0.0–7.0)
MCH: 24.7 pg — ABNORMAL LOW (ref 25.1–34.0)
MCV: 74.5 fL — ABNORMAL LOW (ref 79.5–101.0)
MONO%: 8.2 % (ref 0.0–14.0)
NEUT#: 4.8 10*3/uL (ref 1.5–6.5)
RBC: 5.71 10*6/uL — ABNORMAL HIGH (ref 3.70–5.45)
RDW: 15.1 % — ABNORMAL HIGH (ref 11.2–14.5)

## 2009-06-05 LAB — VITAMIN D 25 HYDROXY (VIT D DEFICIENCY, FRACTURES): Vit D, 25-Hydroxy: 44 ng/mL (ref 30–89)

## 2009-06-23 ENCOUNTER — Encounter: Admission: RE | Admit: 2009-06-23 | Discharge: 2009-06-23 | Payer: Self-pay | Admitting: Oncology

## 2009-11-28 ENCOUNTER — Ambulatory Visit: Payer: Self-pay | Admitting: Oncology

## 2010-03-16 ENCOUNTER — Encounter (HOSPITAL_BASED_OUTPATIENT_CLINIC_OR_DEPARTMENT_OTHER)
Admission: RE | Admit: 2010-03-16 | Discharge: 2010-04-26 | Payer: Self-pay | Source: Home / Self Care | Attending: General Surgery | Admitting: General Surgery

## 2010-05-21 ENCOUNTER — Emergency Department (HOSPITAL_COMMUNITY)
Admission: EM | Admit: 2010-05-21 | Discharge: 2010-05-21 | Payer: Self-pay | Source: Home / Self Care | Admitting: Family Medicine

## 2010-05-26 ENCOUNTER — Encounter: Payer: Self-pay | Admitting: Oncology

## 2010-05-27 ENCOUNTER — Encounter: Payer: Self-pay | Admitting: General Surgery

## 2010-05-27 ENCOUNTER — Encounter: Payer: Self-pay | Admitting: Oncology

## 2010-05-28 ENCOUNTER — Encounter: Payer: Self-pay | Admitting: Oncology

## 2010-06-18 ENCOUNTER — Other Ambulatory Visit: Payer: Self-pay | Admitting: Oncology

## 2010-09-21 NOTE — Op Note (Signed)
NAME:  Alicia Kaufman           ACCOUNT NO.:  [id number]   MEDICAL RECORD NO.:  [id number]          PATIENT TYPE:  AMB   LOCATION:  DSC                          FACILITY:  [facility]   PHYSICIAN:  Rose Phi. Maple Hudson, M.D.   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   OPERATION:  Insertion of Port-A-Cath.   SURGEON:  Rose Phi. Maple Hudson, M.D.   ANESTHESIA:  MAC.   DESCRIPTION OF PROCEDURE:  The patient placed on the operating table and the  right chest wall and neck prepped and draped in the usual fashion.  Under  local anesthesia, right subclavian puncture was carried out without  difficulty and the guidewire inserted and proper positioning of the wire  confirmed by fluoroscopy.   Under local anesthesia, a small incision was made on the anterior chest wall  and a pocket developed for the implantable port.  I then tunneled between  the subclavian puncture site and the newly developed pocket and pulled the  catheter through there and then connected to the port and placed the port in  the proper position in the pocket.  We measured the catheter to go to what  appeared to be the fourth interspace at the cavoatrial junction.  I then  passed the dilator and peel-away sheath over the wire and removed the wire  and the dilator and passed the catheter through the peel-away sheath and  then removed it.   Again, fluoroscopy was used to confirm that the tip was in the superior vena  cava and that there was no kinking in the system.   I then accessed the port and aspirated and then fully heparinized it.   The incisions were closed with 3-0 Vicryl and subcuticular 4-0 Monocryl and  skin glue.  Dressings applied.  The patient transferred to the recovery room  in satisfactory  condition having tolerated the procedure well.      Pete   PRY/MEDQ  D:  01/31/2004  T:  01/31/2004  Job:  [id number]

## 2010-09-21 NOTE — Op Note (Signed)
NAME:  Alicia Kaufman           ACCOUNT NO.:  [id number]   MEDICAL RECORD NO.:  [id number]          PATIENT TYPE:  AMB   LOCATION:  DSC                          FACILITY:  [facility]   PHYSICIAN:  Rose Phi. Maple Hudson, M.D.   DATE OF BIRTH:  1960-11-18   DATE OF PROCEDURE:  06/10/2006  DATE OF DISCHARGE:                               OPERATIVE REPORT   PREOPERATIVE DIAGNOSIS:  Carcinoma left breast.   POSTOPERATIVE DIAGNOSIS:  Carcinoma left breast.   OPERATION:  Removal of Port-A-Cath.   SURGEON:  Rose Phi. Maple Hudson, M.D.   ANESTHESIA:  Local.   OPERATIVE PROCEDURE:  The patient placed on the operating table with the  arms down by side.  The right upper chest over the Port-A-Cath was  prepped and draped in the usual fashion.  After obtaining good local  anesthesia with the 1% Xylocaine with adrenaline, a short transverse  incision was made and the port exposed.  The catheter was grasped then  removed from the subclavian vein with no bleeding.   The two sutures holding it in place were divided and the port removed.   Again with no bleeding a subcuticular closure 4-0 Monocryl and Steri-  Strips carried out.  Dressing applied.  The patient then allowed to go  home.      Rose Phi. Maple Hudson, M.D.  Electronically Signed     PRY/MEDQ  D:  06/10/2006  T:  06/10/2006  Job:  [id number]

## 2010-10-21 ENCOUNTER — Emergency Department (HOSPITAL_COMMUNITY): Payer: Medicaid Other

## 2010-10-21 ENCOUNTER — Emergency Department (HOSPITAL_COMMUNITY)
Admission: EM | Admit: 2010-10-21 | Discharge: 2010-10-21 | Disposition: A | Discharge status: Home / Self Care | Payer: Medicaid Other | Attending: Emergency Medicine | Admitting: Emergency Medicine

## 2010-10-21 DIAGNOSIS — Z853 Personal history of malignant neoplasm of breast: Secondary | ICD-10-CM | POA: Insufficient documentation

## 2010-10-21 DIAGNOSIS — R42 Dizziness and giddiness: Secondary | ICD-10-CM | POA: Insufficient documentation

## 2010-10-21 DIAGNOSIS — E876 Hypokalemia: Secondary | ICD-10-CM | POA: Insufficient documentation

## 2010-10-21 DIAGNOSIS — I498 Other specified cardiac arrhythmias: Secondary | ICD-10-CM | POA: Insufficient documentation

## 2010-10-21 DIAGNOSIS — F411 Generalized anxiety disorder: Secondary | ICD-10-CM | POA: Insufficient documentation

## 2010-10-21 DIAGNOSIS — H538 Other visual disturbances: Secondary | ICD-10-CM | POA: Insufficient documentation

## 2010-10-21 LAB — POCT I-STAT, CHEM 8
BUN: 11 mg/dL (ref 6–23)
Calcium, Ion: 1.13 mmol/L (ref 1.12–1.32)
Chloride: 102 mEq/L (ref 96–112)
Glucose, Bld: 130 mg/dL — ABNORMAL HIGH (ref 70–99)

## 2010-10-22 LAB — POCT PREGNANCY, URINE: Preg Test, Ur: NEGATIVE

## 2011-06-30 ENCOUNTER — Emergency Department (HOSPITAL_COMMUNITY)
Admission: EM | Admit: 2011-06-30 | Discharge: 2011-06-30 | Disposition: A | Discharge status: Home / Self Care | Payer: Medicare Other | Attending: Emergency Medicine | Admitting: Emergency Medicine

## 2011-06-30 ENCOUNTER — Encounter (HOSPITAL_COMMUNITY): Payer: Self-pay | Admitting: Emergency Medicine

## 2011-06-30 DIAGNOSIS — R1033 Periumbilical pain: Secondary | ICD-10-CM

## 2011-06-30 DIAGNOSIS — R109 Unspecified abdominal pain: Secondary | ICD-10-CM | POA: Insufficient documentation

## 2011-06-30 HISTORY — DX: Dermatitis, unspecified: L30.9

## 2011-06-30 LAB — COMPREHENSIVE METABOLIC PANEL
AST: 19 U/L (ref 0–37)
Albumin: 3.8 g/dL (ref 3.5–5.2)
Calcium: 9.2 mg/dL (ref 8.4–10.5)
Chloride: 101 mEq/L (ref 96–112)
Creatinine, Ser: 1.07 mg/dL (ref 0.50–1.10)
Total Bilirubin: 0.4 mg/dL (ref 0.3–1.2)
Total Protein: 8.1 g/dL (ref 6.0–8.3)

## 2011-06-30 LAB — URINALYSIS, ROUTINE W REFLEX MICROSCOPIC
Glucose, UA: NEGATIVE mg/dL
Leukocytes, UA: NEGATIVE
Specific Gravity, Urine: 1.02 (ref 1.005–1.030)
pH: 6 (ref 5.0–8.0)

## 2011-06-30 LAB — CBC
MCV: 72.2 fL — ABNORMAL LOW (ref 78.0–100.0)
Platelets: 261 10*3/uL (ref 150–400)
RDW: 14.6 % (ref 11.5–15.5)
WBC: 9.3 10*3/uL (ref 4.0–10.5)

## 2011-06-30 LAB — DIFFERENTIAL
Basophils Absolute: 0 10*3/uL (ref 0.0–0.1)
Lymphs Abs: 2 10*3/uL (ref 0.7–4.0)
Monocytes Relative: 8 % (ref 3–12)

## 2011-06-30 LAB — LACTIC ACID, PLASMA: Lactic Acid, Venous: 1.2 mmol/L (ref 0.5–2.2)

## 2011-06-30 MED ORDER — OXYCODONE-ACETAMINOPHEN 5-325 MG PO TABS: 2.0000 | ORAL_TABLET | Freq: Four times a day (QID) | ORAL | Status: DC | PRN

## 2011-06-30 MED ORDER — MORPHINE SULFATE 4 MG/ML IJ SOLN
4.0000 mg | Freq: Once | INTRAMUSCULAR | Status: AC
  Administered 2011-06-30: 4 mg via INTRAVENOUS
  Filled 2011-06-30: qty 1

## 2011-06-30 NOTE — ED Notes (Signed)
Pt c/o peri umbilical pain onset Nov 2012, worse with exercise. Denies n/v/d. Pt states she does have reducable mass after doing sit ups. Pt states pain worse now.

## 2011-06-30 NOTE — ED Provider Notes (Signed)
History     CSN: [id number]  Arrival date & time 06/30/11  1518   First MD Initiated Contact with Patient 06/30/11 1647      Chief Complaint  Patient presents with  . Abdominal Pain    (Consider location/radiation/quality/duration/timing/severity/associated sxs/prior treatment) HPI Patient is a 51 yo F who complains of pain and a "knot: just above the umbilicus that she first noted in October. Her pain correlates with the appearance of this knot which occurs following a large meal and with activities causing valsalva.  She has history of c-section but no other surgeries.  She last had a bowel movement this afternoon and notes no constipation or diarrhea.  She denies fevers, nausea, or vomiting.  She rates her pain as an 8/10 at its most severe and this improves with time and when the bulge in her stomach resolves.  Patient has 6/10 pain currently and does have a palpable knot above the umbilicus consistent with preiumbilical hernia that is not currently reducible.  There are no other associated or modifying factors.  Past Medical History  Diagnosis Date  . Eczema     Past Surgical History  Procedure Date  . Cesarean section     History reviewed. No pertinent family history.  History  Substance Use Topics  . Smoking status: Former Games developer  . Smokeless tobacco: Not on file  . Alcohol Use: Yes     occasional    OB History    Grav Para Term Preterm Abortions TAB SAB Ect Mult Living                  Review of Systems  Constitutional: Negative.   HENT: Negative.   Eyes: Negative.   Respiratory: Negative.   Cardiovascular: Negative.   Gastrointestinal: Positive for abdominal pain.  Genitourinary: Negative.   Musculoskeletal: Negative.   Skin: Negative.   Neurological: Negative.   Hematological: Negative.   Psychiatric/Behavioral: Negative.   All other systems reviewed and are negative.    Allergies  Review of patient's allergies indicates no known  allergies.  Home Medications   Current Outpatient Rx  Name Route Sig Dispense Refill  . OXYCODONE-ACETAMINOPHEN 5-325 MG PO TABS Oral Take 2 tablets by mouth every 6 (six) hours as needed for pain. 15 tablet 0    BP 151/80  Pulse 103  Temp(Src) 98.6 F (37 C) (Oral)  Resp 18  SpO2 97%  LMP 06/30/2011  Physical Exam  Nursing note and vitals reviewed. GEN: Well-developed, well-nourished female in no distress HEENT: Atraumatic, normocephalic. Oropharynx clear without erythema EYES: PERRLA BL, no scleral icterus. NECK: Trachea midline, no meningismus CV: regular rate and rhythm. No murmurs, rubs, or gallops PULM: No respiratory distress.  No crackles, wheezes, or rales. GI: soft, patient with palpable bulge consistent with periumbilical hernia palpated superior to the umbilicus and noted to be 2 inches in diameter with TTP and not currently reducible. No guarding, rebound, + bowel sounds  Neuro: cranial nerves 2-12 intact, no abnormalities of strength or sensation, A and O x 3 MSK: Patient moves all 4 extremities symmetrically, no deformity, edema, or injury noted Psych: no abnormality of mood   ED Course  Procedures (including critical care time)  Labs Reviewed  CBC - Abnormal; Notable for the following:    RBC 5.90 (*)    MCV 72.2 (*)    MCH 24.4 (*)    All other components within normal limits  COMPREHENSIVE METABOLIC PANEL - Abnormal; Notable for the following:  GFR calc non Af Amer 59 (*)    GFR calc Af Amer 68 (*)    All other components within normal limits  DIFFERENTIAL  LIPASE, BLOOD  URINALYSIS, ROUTINE W REFLEX MICROSCOPIC  LACTIC ACID, PLASMA   No results found.   1. Abdominal pain   2. Umbilical pain       MDM  Patient was evaluated and had TTP overlying a palpable bulge in the periumbilical region.  Patient was given morphine and ice pack was applied while labs to verify no other causes of her abdominal pain were performed.  A lactate was also  added to confirm no significant evidence for bowel injury.  All labs were WNL.  Patient's hernia reduced and she had resolution of her pain.  She is not obstructed today.  She was referred to [place] [facility] for discussion of possible elective repair given her increasing frequency of symptoms and discomfort.  She was discharged in good condition with a prescription for pain medicine.  She was instructed to take this only when the bulge appears in combination with her pain at home.  If she is unable to reduce her hernia she should lie down, take the medicine, and apply ice as we have done here.  If she is still unable to reduce the hernia she should seek additional medical care.  Other reasons to return including signs of obstruction were reviewed with the patient and she was discharged in good condition.        Cyndra Numbers, MD 07/01/11 [phone]

## 2011-06-30 NOTE — Discharge Instructions (Signed)
When the bulge in your stomach appears you should use ice pack as well as pain medications to try and get this to go back into your stomach. If you cannot make the bolts go back into your stomach you need to be evaluated at an emergency department or any other physician's office. This could indicate that you may need surgery emergently. Abdominal Pain Abdominal pain can be caused by many things. Your caregiver decides the seriousness of your pain by an examination and possibly blood tests and X-rays. Many cases can be observed and treated at home. Most abdominal pain is not caused by a disease and will probably improve without treatment. However, in many cases, more time must pass before a clear cause of the pain can be found. Before that point, it may not be known if you need more testing, or if hospitalization or surgery is needed. HOME CARE INSTRUCTIONS   Do not take laxatives unless directed by your caregiver.   Take pain medicine only as directed by your caregiver.   Only take over-the-counter or prescription medicines for pain, discomfort, or fever as directed by your caregiver.   Try a clear liquid diet (broth, tea, or water) for as long as directed by your caregiver. Slowly move to a bland diet as tolerated.  SEEK IMMEDIATE MEDICAL CARE IF:   The pain does not go away.   You have a fever.   You keep throwing up (vomiting).   The pain is felt only in portions of the abdomen. Pain in the right side could possibly be appendicitis. In an adult, pain in the left lower portion of the abdomen could be colitis or diverticulitis.   You pass bloody or black tarry stools.  MAKE SURE YOU:   Understand these instructions.   Will watch your condition.   Will get help right away if you are not doing well or get worse.  Document Released: 01/30/2005 Document Revised: 01/02/2011 Document Reviewed: 12/09/2007 [facility] Patient Information 2012 [place], [place].

## 2011-07-02 ENCOUNTER — Encounter (INDEPENDENT_AMBULATORY_CARE_PROVIDER_SITE_OTHER): Payer: Medicare Other | Admitting: Surgery

## 2011-07-05 ENCOUNTER — Encounter (INDEPENDENT_AMBULATORY_CARE_PROVIDER_SITE_OTHER): Payer: Self-pay | Admitting: Surgery

## 2011-07-05 ENCOUNTER — Ambulatory Visit (INDEPENDENT_AMBULATORY_CARE_PROVIDER_SITE_OTHER): Payer: Medicare Other | Admitting: Surgery

## 2011-07-05 VITALS — BP 158/96 | HR 84 | Temp 98.2°F | Resp 20 | Ht 67.5 in | Wt 264.0 lb

## 2011-07-05 DIAGNOSIS — Z853 Personal history of malignant neoplasm of breast: Secondary | ICD-10-CM

## 2011-07-05 DIAGNOSIS — K439 Ventral hernia without obstruction or gangrene: Secondary | ICD-10-CM

## 2011-07-05 HISTORY — DX: Ventral hernia without obstruction or gangrene: K43.9

## 2011-07-05 NOTE — Patient Instructions (Signed)
We will schedule outpatient surgery to repair the hernia just above your belly-button

## 2011-07-05 NOTE — Progress Notes (Signed)
Patient ID: Alicia Kaufman, female   DOB: [birth year], 51 y.o.   MRN: [id number]  Chief Complaint  Patient presents with  . Umbilical Hernia    new pt    HPI Alicia Kaufman is a 51 y.o. female.  She was seen and there was a long emergency room for abdominal pain about a week ago. In reviewing the emergency room dose it was apparent that she had a small epigastric hernia that was giving her the pain. He was able to be reduced and she was able to be discharged and comes here to discuss possible surgical intervention.  She thinks that she has had a bulge in that area since October. She noticed it squishes around. She is not currently having pain. HPI  Past Medical History  Diagnosis Date  . Eczema   . Umbilical hernia   . History of breast cancer     left  . Epigastric hernia 07/05/2011    Past Surgical History  Procedure Date  . Cesarean section   . Portacath placement 2005    Family History  Problem Relation Age of Onset  . Depression Mother   . Hypertension Mother   . Cancer Father     stomach  . Diabetes Father     Social History History  Substance Use Topics  . Smoking status: Never Smoker   . Smokeless tobacco: Not on file  . Alcohol Use: No    No Known Allergies  Current Outpatient Prescriptions  Medication Sig Dispense Refill  . Multiple Vitamin (MULTIVITAMIN) capsule Take 1 capsule by mouth daily.        Review of Systems Review of Systems  Constitutional: Negative for fever, chills and unexpected weight change.  HENT: Negative for hearing loss, congestion, sore throat, trouble swallowing and voice change.   Eyes: Negative for visual disturbance.  Respiratory: Negative for cough and wheezing.   Cardiovascular: Negative for chest pain, palpitations and leg swelling.  Gastrointestinal: Positive for abdominal pain. Negative for nausea, vomiting, diarrhea, constipation, blood in stool, abdominal distention and anal bleeding.       Pain related to her hernia   Genitourinary: Negative for hematuria, vaginal bleeding and difficulty urinating.  Musculoskeletal: Negative for arthralgias.  Skin: Negative for rash and wound.  Neurological: Negative for seizures, syncope and headaches.  Hematological: Negative for adenopathy. Does not bruise/bleed easily.  Psychiatric/Behavioral: Negative for confusion.    Blood pressure 158/96, pulse 84, temperature 98.2 F (36.8 C), temperature source Temporal, resp. rate 20, height 5' 7.5" (1.715 m), weight 264 lb (119.75 kg), last menstrual period 06/30/2011.  Physical Exam Physical Exam  Vitals reviewed. Constitutional: She is oriented to person, place, and time. She appears well-developed and well-nourished. No distress.  HENT:  Head: Normocephalic and atraumatic.  Mouth/Throat: Oropharynx is clear and moist.  Eyes: Conjunctivae and EOM are normal. Pupils are equal, round, and reactive to light. No scleral icterus.  Neck: Normal range of motion. Neck supple. No tracheal deviation present. No thyromegaly present.  Cardiovascular: Normal rate, regular rhythm, normal heart sounds and intact distal pulses.  Exam reveals no gallop and no friction rub.   No murmur heard. Pulmonary/Chest: Effort normal and breath sounds normal. No respiratory distress. She has no wheezes. She has no rales.  Abdominal: Soft. Bowel sounds are normal. She exhibits no distension and no mass. There is no tenderness. There is no rebound and no guarding.         Reducible epigastric hernia. Cannot detect a defect at the  umbilicus   Musculoskeletal: Normal range of motion. She exhibits no edema and no tenderness.  Neurological: She is alert and oriented to person, place, and time.  Skin: Skin is warm and dry. No rash noted. She is not diaphoretic. No erythema.  Psychiatric: She has a normal mood and affect. Her behavior is normal. Judgment and thought content normal.    Data Reviewed I have reviewed her notes from the ED  visit  Assessment    Reducible Epigastric hernia with recent incarceration    Plan    Repair. I have reviewed the plans, risks and complications etc. She wishes to schedule surgery       Beaux Wedemeyer J 07/05/2011, 10:34 AM

## 2011-07-11 ENCOUNTER — Encounter (INDEPENDENT_AMBULATORY_CARE_PROVIDER_SITE_OTHER): Payer: Self-pay | Admitting: Surgery

## 2011-07-18 ENCOUNTER — Encounter (HOSPITAL_COMMUNITY): Payer: Self-pay

## 2011-07-18 ENCOUNTER — Encounter (HOSPITAL_COMMUNITY)
Admission: RE | Admit: 2011-07-18 | Discharge: 2011-07-18 | Disposition: A | Discharge status: Home / Self Care | Payer: Self-pay | Source: Ambulatory Visit | Attending: Surgery | Admitting: Surgery

## 2011-07-18 ENCOUNTER — Encounter (HOSPITAL_COMMUNITY): Payer: Self-pay | Admitting: Pharmacy Technician

## 2011-07-18 HISTORY — DX: Sarcoidosis, unspecified: D86.9

## 2011-07-18 HISTORY — DX: Anxiety disorder, unspecified: F41.9

## 2011-07-18 HISTORY — DX: Unspecified osteoarthritis, unspecified site: M19.90

## 2011-07-18 LAB — SURGICAL PCR SCREEN
MRSA, PCR: POSITIVE — AB
Staphylococcus aureus: POSITIVE — AB

## 2011-07-18 MED ORDER — CHLORHEXIDINE GLUCONATE 4 % EX LIQD
1.0000 "application " | Freq: Once | CUTANEOUS | Status: DC
  Filled 2011-07-18: qty 15

## 2011-07-18 NOTE — Patient Instructions (Addendum)
[name]  07/18/2011   Your procedure is scheduled on:  07/22/11  Report to SHORT STAY DEPT  at 5:45 AM.  Call this number if you have problems the morning of surgery: [phone]   Remember:   Do not eat food or drink liquids AFTER MIDNIGHT  May have clear liquids UNTIL 6 HOURS BEFORE SURGERY  Clear liquids include soda, tea, black coffee, apple or grape juice, broth.  Take these medicines the morning of surgery with A SIP OF WATER: NONE   Do not wear jewelry, make-up or nail polish.  Do not wear lotions, powders, or perfumes.   Do not shave legs or underarms 48 hrs. before surgery (men may shave face)  Do not bring valuables to the hospital.  Contacts, dentures or bridgework may not be worn into surgery.  Leave suitcase in the car. After surgery it may be brought to your room.  For patients admitted to the hospital, checkout time is 11:00 AM the day of discharge.   Patients discharged the day of surgery will not be allowed to drive home.  Name and phone number of your driver:   Special Instructions:   Please read over the following fact sheets that you were given: MRSA  Information               SHOWER WITH BETASEPT THE NIGHT BEFORE SURGERY AND THE MORNING OF SURGERY

## 2011-07-18 NOTE — Pre-Procedure Instructions (Signed)
CBC /C/MET/UA/LIPASE/LACTIC ACID --DONE 06/30/11 IN EPIC EKG DONE 10/23/11 IN EPIC

## 2011-07-22 ENCOUNTER — Ambulatory Visit (HOSPITAL_COMMUNITY)
Admission: RE | Admit: 2011-07-22 | Discharge: 2011-07-22 | Disposition: A | Discharge status: Home / Self Care | Payer: Self-pay | Source: Ambulatory Visit | Attending: Surgery | Admitting: Surgery

## 2011-07-22 ENCOUNTER — Encounter (HOSPITAL_COMMUNITY): Payer: Self-pay | Admitting: Anesthesiology

## 2011-07-22 ENCOUNTER — Ambulatory Visit (HOSPITAL_COMMUNITY): Payer: Self-pay | Admitting: Anesthesiology

## 2011-07-22 ENCOUNTER — Encounter (HOSPITAL_COMMUNITY)
Admission: RE | Disposition: A | Discharge status: Home / Self Care | Payer: Self-pay | Source: Ambulatory Visit | Attending: Surgery

## 2011-07-22 ENCOUNTER — Encounter (HOSPITAL_COMMUNITY): Payer: Self-pay

## 2011-07-22 DIAGNOSIS — K439 Ventral hernia without obstruction or gangrene: Secondary | ICD-10-CM

## 2011-07-22 DIAGNOSIS — Z01812 Encounter for preprocedural laboratory examination: Secondary | ICD-10-CM | POA: Insufficient documentation

## 2011-07-22 DIAGNOSIS — Z853 Personal history of malignant neoplasm of breast: Secondary | ICD-10-CM | POA: Insufficient documentation

## 2011-07-22 DIAGNOSIS — K436 Other and unspecified ventral hernia with obstruction, without gangrene: Secondary | ICD-10-CM | POA: Insufficient documentation

## 2011-07-22 HISTORY — PX: EPIGASTRIC HERNIA REPAIR: SHX404

## 2011-07-22 SURGERY — REPAIR, HERNIA, EPIGASTRIC, ADULT
Anesthesia: General | Site: Abdomen | Wound class: Clean

## 2011-07-22 MED ORDER — LACTATED RINGERS IV SOLN: INTRAVENOUS | Status: DC

## 2011-07-22 MED ORDER — HEPARIN SODIUM (PORCINE) 5000 UNIT/ML IJ SOLN
5000.0000 [IU] | Freq: Once | INTRAMUSCULAR | Status: AC
  Administered 2011-07-22: 5000 [IU] via SUBCUTANEOUS

## 2011-07-22 MED ORDER — BUPIVACAINE-EPINEPHRINE PF 0.25-1:200000 % IJ SOLN
INTRAMUSCULAR | Status: AC
  Filled 2011-07-22: qty 30

## 2011-07-22 MED ORDER — NEOSTIGMINE METHYLSULFATE 1 MG/ML IJ SOLN
INTRAMUSCULAR | Status: DC | PRN
  Administered 2011-07-22: 3.5 mg via INTRAVENOUS

## 2011-07-22 MED ORDER — FENTANYL CITRATE 0.05 MG/ML IJ SOLN
INTRAMUSCULAR | Status: DC | PRN
  Administered 2011-07-22: 50 ug via INTRAVENOUS
  Administered 2011-07-22 (×2): 100 ug via INTRAVENOUS

## 2011-07-22 MED ORDER — BUPIVACAINE HCL 0.25 % IJ SOLN
INTRAMUSCULAR | Status: DC | PRN
  Administered 2011-07-22: 30 mL

## 2011-07-22 MED ORDER — HYDROCODONE-ACETAMINOPHEN 5-325 MG PO TABS
ORAL_TABLET | ORAL | Status: AC
  Administered 2011-07-22: 1 via ORAL
  Filled 2011-07-22: qty 1

## 2011-07-22 MED ORDER — PROPOFOL 10 MG/ML IV EMUL
INTRAVENOUS | Status: DC | PRN
  Administered 2011-07-22: 200 mg via INTRAVENOUS

## 2011-07-22 MED ORDER — MEPERIDINE HCL 50 MG/ML IJ SOLN: 6.2500 mg | INTRAMUSCULAR | Status: DC | PRN

## 2011-07-22 MED ORDER — HYDROMORPHONE HCL PF 1 MG/ML IJ SOLN: 0.2500 mg | INTRAMUSCULAR | Status: DC | PRN

## 2011-07-22 MED ORDER — PROMETHAZINE HCL 25 MG/ML IJ SOLN: 6.2500 mg | INTRAMUSCULAR | Status: DC | PRN

## 2011-07-22 MED ORDER — CEFAZOLIN SODIUM 1-5 GM-% IV SOLN
INTRAVENOUS | Status: AC
  Filled 2011-07-22: qty 100

## 2011-07-22 MED ORDER — HYDROMORPHONE HCL PF 1 MG/ML IJ SOLN
0.2500 mg | INTRAMUSCULAR | Status: DC | PRN
  Administered 2011-07-22 (×2): 0.5 mg via INTRAVENOUS

## 2011-07-22 MED ORDER — HYDROCODONE-ACETAMINOPHEN 5-325 MG PO TABS
1.0000 | ORAL_TABLET | ORAL | Status: DC | PRN
  Administered 2011-07-22: 1 via ORAL

## 2011-07-22 MED ORDER — PROMETHAZINE HCL 25 MG/ML IJ SOLN
6.2500 mg | INTRAMUSCULAR | Status: DC | PRN
  Administered 2011-07-22: 6.25 mg via INTRAVENOUS

## 2011-07-22 MED ORDER — ACETAMINOPHEN 10 MG/ML IV SOLN
INTRAVENOUS | Status: DC | PRN
  Administered 2011-07-22: 1000 mg via INTRAVENOUS

## 2011-07-22 MED ORDER — ACETAMINOPHEN 10 MG/ML IV SOLN
INTRAVENOUS | Status: AC
  Filled 2011-07-22: qty 100

## 2011-07-22 MED ORDER — LIDOCAINE HCL (CARDIAC) 20 MG/ML IV SOLN
INTRAVENOUS | Status: DC | PRN
  Administered 2011-07-22: 100 mg via INTRAVENOUS

## 2011-07-22 MED ORDER — ONDANSETRON HCL 4 MG/2ML IJ SOLN
INTRAMUSCULAR | Status: DC | PRN
  Administered 2011-07-22: 4 mg via INTRAVENOUS

## 2011-07-22 MED ORDER — BUPIVACAINE HCL (PF) 0.25 % IJ SOLN
INTRAMUSCULAR | Status: AC
  Filled 2011-07-22: qty 30

## 2011-07-22 MED ORDER — PROMETHAZINE HCL 25 MG/ML IJ SOLN
INTRAMUSCULAR | Status: AC
  Administered 2011-07-22: 6.25 mg via INTRAVENOUS
  Filled 2011-07-22: qty 1

## 2011-07-22 MED ORDER — GLYCOPYRROLATE 0.2 MG/ML IJ SOLN
INTRAMUSCULAR | Status: DC | PRN
  Administered 2011-07-22: 0.6 mg via INTRAVENOUS

## 2011-07-22 MED ORDER — HEPARIN SODIUM (PORCINE) 5000 UNIT/ML IJ SOLN
INTRAMUSCULAR | Status: AC
  Administered 2011-07-22: 5000 [IU] via SUBCUTANEOUS
  Filled 2011-07-22: qty 1

## 2011-07-22 MED ORDER — HYDROMORPHONE HCL PF 1 MG/ML IJ SOLN
INTRAMUSCULAR | Status: AC
  Filled 2011-07-22: qty 1

## 2011-07-22 MED ORDER — HYDROCODONE-ACETAMINOPHEN 5-325 MG PO TABS: 1.0000 | ORAL_TABLET | ORAL | Status: AC | PRN

## 2011-07-22 MED ORDER — MIDAZOLAM HCL 5 MG/5ML IJ SOLN
INTRAMUSCULAR | Status: DC | PRN
  Administered 2011-07-22: 2 mg via INTRAVENOUS

## 2011-07-22 MED ORDER — CEFAZOLIN SODIUM-DEXTROSE 2-3 GM-% IV SOLR
2.0000 g | INTRAVENOUS | Status: AC
  Administered 2011-07-22: 2 g via INTRAVENOUS

## 2011-07-22 MED ORDER — ROCURONIUM BROMIDE 100 MG/10ML IV SOLN
INTRAVENOUS | Status: DC | PRN
  Administered 2011-07-22: 25 mg via INTRAVENOUS

## 2011-07-22 MED ORDER — SUCCINYLCHOLINE CHLORIDE 20 MG/ML IJ SOLN
INTRAMUSCULAR | Status: DC | PRN
  Administered 2011-07-22: 140 mg via INTRAVENOUS

## 2011-07-22 MED ORDER — LACTATED RINGERS IV SOLN
INTRAVENOUS | Status: DC | PRN
  Administered 2011-07-22 (×2): via INTRAVENOUS

## 2011-07-22 MED ORDER — LABETALOL HCL 5 MG/ML IV SOLN
INTRAVENOUS | Status: DC | PRN
  Administered 2011-07-22: 5 mg via INTRAVENOUS

## 2011-07-22 SURGICAL SUPPLY — 33 items
BENZOIN TINCTURE PRP APPL 2/3 (GAUZE/BANDAGES/DRESSINGS) IMPLANT
CHLORAPREP W/TINT 26ML (MISCELLANEOUS) ×2 IMPLANT
CLOTH BEACON ORANGE TIMEOUT ST (SAFETY) ×2 IMPLANT
COTTONBALL LRG STERILE PKG (GAUZE/BANDAGES/DRESSINGS) IMPLANT
DECANTER SPIKE VIAL GLASS SM (MISCELLANEOUS) ×2 IMPLANT
DERMABOND ADVANCED (GAUZE/BANDAGES/DRESSINGS) ×1
DERMABOND ADVANCED .7 DNX12 (GAUZE/BANDAGES/DRESSINGS) ×1 IMPLANT
DRAPE LAPAROSCOPIC ABDOMINAL (DRAPES) ×2 IMPLANT
ELECT REM PT RETURN 9FT ADLT (ELECTROSURGICAL) ×2
ELECTRODE REM PT RTRN 9FT ADLT (ELECTROSURGICAL) ×1 IMPLANT
GLOVE BIOGEL PI IND STRL 7.0 (GLOVE) ×1 IMPLANT
GLOVE BIOGEL PI INDICATOR 7.0 (GLOVE) ×1
GLOVE EUDERMIC 7 POWDERFREE (GLOVE) ×2 IMPLANT
GOWN STRL NON-REIN LRG LVL3 (GOWN DISPOSABLE) ×2 IMPLANT
GOWN STRL REIN XL XLG (GOWN DISPOSABLE) ×4 IMPLANT
KIT BASIN OR (CUSTOM PROCEDURE TRAY) ×2 IMPLANT
MESH VENTRALEX ST 1-7/10 CRC S (Mesh General) ×2 IMPLANT
NEEDLE HYPO 22GX1.5 SAFETY (NEEDLE) ×2 IMPLANT
NS IRRIG 1000ML POUR BTL (IV SOLUTION) ×2 IMPLANT
PACK BASIC VI WITH GOWN DISP (CUSTOM PROCEDURE TRAY) ×2 IMPLANT
PEN SKIN MARKING BROAD (MISCELLANEOUS) ×2 IMPLANT
PENCIL BUTTON HOLSTER BLD 10FT (ELECTRODE) ×2 IMPLANT
SPONGE GAUZE 4X4 12PLY (GAUZE/BANDAGES/DRESSINGS) IMPLANT
SPONGE LAP 4X18 X RAY DECT (DISPOSABLE) ×4 IMPLANT
STRIP CLOSURE SKIN 1/2X4 (GAUZE/BANDAGES/DRESSINGS) IMPLANT
SUT MNCRL AB 4-0 PS2 18 (SUTURE) ×2 IMPLANT
SUT PROLENE 0 CT 1 CR/8 (SUTURE) ×6 IMPLANT
SUT VIC AB 3-0 SH 27 (SUTURE) ×3
SUT VIC AB 3-0 SH 27XBRD (SUTURE) ×3 IMPLANT
SYR BULB IRRIGATION 50ML (SYRINGE) ×2 IMPLANT
SYR CONTROL 10ML LL (SYRINGE) ×2 IMPLANT
TIPS TEFLON (MISCELLANEOUS) ×2 IMPLANT
TOWEL OR 17X26 10 PK STRL BLUE (TOWEL DISPOSABLE) ×2 IMPLANT

## 2011-07-22 NOTE — Progress Notes (Signed)
States nausea better

## 2011-07-22 NOTE — Op Note (Signed)
Alicia Kaufman 1960-07-09 [id number] 07/10/2011  Preoperative diagnosis: Epigastric hernia, recently incarcerated  Postoperative diagnosis: Same  Procedure: Repair with mesh  Surgeon: Currie Paris, MD, FACS  Assistant: None  Anesthesia: General   Clinical History and Indications: This patient recently was in the ED with an incarcerated epigastric hernia which was able to be reduced. She now presents for operative repair.    Description of Procedure: I saw the patient in the holding area and reviewed the plans, answered questions and marked the site. The patient was then taken to the operating room, and after satisfactory GET anesthesia was obtained, the abdomen was prepped and draped and the time out done.  I made a vertical incision over the hernia, divided the sub Q and identified the sac. The sac was cleaned off the subQ and the fascial edge of the defect identified. The sac was fairly thick, C/W the chronic nature of the hernia.  The sac was opened, then excised using cautery.I made sure there was free peritoneal space with nothing to hinder the closure. A mesh patch was placed and sutured in using the straps and 0 Prolene. The fascia was closed over the mesh with additional sutures of 0 Prolene. The defect measured 2 cm and I used the 4.2 cm mesh patch. Everything appeared dry prior to placing the patch. The sub Q was irrigated, 30 cc of 0.25% plain marcaine infiltrated and the wound closed in layers with 3-0 Vicryl and 4-0 monocryl and dermabond.  The patient tolerated the procedure well, all counts were correct, EBL was minimal  Currie Paris, MD, FACS 07/22/2011 9:21 AM

## 2011-07-22 NOTE — Preoperative (Signed)
Beta Blockers   Reason not to administer Beta Blockers:Not Applicable 

## 2011-07-22 NOTE — H&P (View-Only) (Signed)
Patient ID: Alicia Kaufman, female   DOB: 07/17/1960, 50 y.o.   MRN: [id number]  Chief Complaint  Patient presents with  . Umbilical Hernia    new pt    HPI Alicia Kaufman is a 50 y.o. female.  She was seen and there was a long emergency room for abdominal pain about a week ago. In reviewing the emergency room dose it was apparent that she had a small epigastric hernia that was giving her the pain. He was able to be reduced and she was able to be discharged and comes here to discuss possible surgical intervention.  She thinks that she has had a bulge in that area since October. She noticed it squishes around. She is not currently having pain. HPI  Past Medical History  Diagnosis Date  . Eczema   . Umbilical hernia   . History of breast cancer     left  . Epigastric hernia 07/05/2011    Past Surgical History  Procedure Date  . Cesarean section   . Portacath placement 2005    Family History  Problem Relation Age of Onset  . Depression Mother   . Hypertension Mother   . Cancer Father     stomach  . Diabetes Father     Social History History  Substance Use Topics  . Smoking status: Never Smoker   . Smokeless tobacco: Not on file  . Alcohol Use: No    No Known Allergies  Current Outpatient Prescriptions  Medication Sig Dispense Refill  . Multiple Vitamin (MULTIVITAMIN) capsule Take 1 capsule by mouth daily.        Review of Systems Review of Systems  Constitutional: Negative for fever, chills and unexpected weight change.  HENT: Negative for hearing loss, congestion, sore throat, trouble swallowing and voice change.   Eyes: Negative for visual disturbance.  Respiratory: Negative for cough and wheezing.   Cardiovascular: Negative for chest pain, palpitations and leg swelling.  Gastrointestinal: Positive for abdominal pain. Negative for nausea, vomiting, diarrhea, constipation, blood in stool, abdominal distention and anal bleeding.       Pain related to her hernia   Genitourinary: Negative for hematuria, vaginal bleeding and difficulty urinating.  Musculoskeletal: Negative for arthralgias.  Skin: Negative for rash and wound.  Neurological: Negative for seizures, syncope and headaches.  Hematological: Negative for adenopathy. Does not bruise/bleed easily.  Psychiatric/Behavioral: Negative for confusion.    Blood pressure 158/96, pulse 84, temperature 98.2 F (36.8 C), temperature source Temporal, resp. rate 20, height 5' 7.5" (1.715 m), weight 264 lb (119.75 kg), last menstrual period 06/30/2011.  Physical Exam Physical Exam  Vitals reviewed. Constitutional: She is oriented to person, place, and time. She appears well-developed and well-nourished. No distress.  HENT:  Head: Normocephalic and atraumatic.  Mouth/Throat: Oropharynx is clear and moist.  Eyes: Conjunctivae and EOM are normal. Pupils are equal, round, and reactive to light. No scleral icterus.  Neck: Normal range of motion. Neck supple. No tracheal deviation present. No thyromegaly present.  Cardiovascular: Normal rate, regular rhythm, normal heart sounds and intact distal pulses.  Exam reveals no gallop and no friction rub.   No murmur heard. Pulmonary/Chest: Effort normal and breath sounds normal. No respiratory distress. She has no wheezes. She has no rales.  Abdominal: Soft. Bowel sounds are normal. She exhibits no distension and no mass. There is no tenderness. There is no rebound and no guarding.         Reducible epigastric hernia. Cannot detect a defect at the   umbilicus   Musculoskeletal: Normal range of motion. She exhibits no edema and no tenderness.  Neurological: She is alert and oriented to person, place, and time.  Skin: Skin is warm and dry. No rash noted. She is not diaphoretic. No erythema.  Psychiatric: She has a normal mood and affect. Her behavior is normal. Judgment and thought content normal.    Data Reviewed I have reviewed her notes from the ED  visit  Assessment    Reducible Epigastric hernia with recent incarceration    Plan    Repair. I have reviewed the plans, risks and complications etc. She wishes to schedule surgery       Amandamarie Feggins J 07/05/2011, 10:34 AM    

## 2011-07-22 NOTE — Anesthesia Postprocedure Evaluation (Signed)
  Anesthesia Post-op Note  Patient: Alicia Kaufman  Procedure(s) Performed: Procedure(s) (LRB): HERNIA REPAIR EPIGASTRIC ADULT (N/A) INSERTION OF MESH (N/A)  Patient Location: PACU  Anesthesia Type: General  Level of Consciousness: awake and alert   Airway and Oxygen Therapy: Patient Spontanous Breathing  Post-op Pain: mild  Post-op Assessment: Post-op Vital signs reviewed, Patient's Cardiovascular Status Stable, Respiratory Function Stable, Patent Airway and No signs of Nausea or vomiting  Post-op Vital Signs: stable  Complications: No apparent anesthesia complications

## 2011-07-22 NOTE — Transfer of Care (Signed)
Immediate Anesthesia Transfer of Care Note  Patient: Alicia Kaufman  Procedure(s) Performed: Procedure(s) (LRB): HERNIA REPAIR EPIGASTRIC ADULT (N/A) INSERTION OF MESH (N/A)  Patient Location: PACU  Anesthesia Type: General  Level of Consciousness: awake, alert , oriented, patient cooperative and responds to stimulation  Airway & Oxygen Therapy: Patient Spontanous Breathing and Patient connected to face mask oxygen  Post-op Assessment: Report given to PACU RN and Post -op Vital signs reviewed and stable  Post vital signs: Reviewed and stable  Complications: No apparent anesthesia complications

## 2011-07-22 NOTE — Anesthesia Preprocedure Evaluation (Addendum)
Anesthesia Evaluation  Patient identified by MRN, date of birth, ID band Patient awake    Reviewed: Allergy & Precautions, H&P , NPO status , Patient's Chart, lab work & pertinent test results  Airway Mallampati: II TM Distance: >3 FB Neck ROM: Full    Dental No notable dental hx.    Pulmonary neg pulmonary ROS,  Sarcoidosis  breath sounds clear to auscultation  Pulmonary exam normal       Cardiovascular negative cardio ROS  IRhythm:Regular Rate:Normal     Neuro/Psych negative neurological ROS  negative psych ROS   GI/Hepatic negative GI ROS, Neg liver ROS,   Endo/Other  negative endocrine ROS  Renal/GU negative Renal ROS  negative genitourinary   Musculoskeletal negative musculoskeletal ROS (+)   Abdominal   Peds negative pediatric ROS (+)  Hematology negative hematology ROS (+)   Anesthesia Other Findings   Reproductive/Obstetrics negative OB ROS                         Anesthesia Physical Anesthesia Plan  ASA: II  Anesthesia Plan: General   Post-op Pain Management:    Induction: Intravenous  Airway Management Planned:   Additional Equipment:   Intra-op Plan:   Post-operative Plan: Extubation in OR  Informed Consent: I have reviewed the patients History and Physical, chart, labs and discussed the procedure including the risks, benefits and alternatives for the proposed anesthesia with the patient or authorized representative who has indicated his/her understanding and acceptance.   Dental advisory given  Plan Discussed with: CRNA  Anesthesia Plan Comments:         Anesthesia Quick Evaluation

## 2011-07-22 NOTE — Interval H&P Note (Signed)
History and Physical Interval Note:  07/22/2011 8:17 AM  Alicia Kaufman  has presented today for surgery, with the diagnosis of EPIGASTRIC  HERNIA   The various methods of treatment have been discussed with the patient and family. After consideration of risks, benefits and other options for treatment, the patient has consented to  Procedure(s) (LRB): HERNIA REPAIR EPIGASTRIC ADULT (N/A) INSERTION OF MESH (N/A) as a surgical intervention .  The patients' history has been reviewed, patient examined, no change in status, stable for surgery.  I have reviewed the patients' chart and labs.  Questions were answered to the patient's satisfaction.    I have marked the site and confirmed with the patient. After she was seen in the office I let Dr Donnie Coffin know, as he does her long term breast cancer follow up   [name]

## 2011-07-25 ENCOUNTER — Telehealth (INDEPENDENT_AMBULATORY_CARE_PROVIDER_SITE_OTHER): Payer: Self-pay | Admitting: General Surgery

## 2011-07-25 NOTE — Telephone Encounter (Signed)
Patient left voicemail complaining of bloating. I called patient back and got voicemail. Left message asking her to call back and if having medical issues she can ask for triage.

## 2011-07-25 NOTE — Telephone Encounter (Signed)
Pt calling in to report good, slow, steady post op progress.  She is consuming water and soup well with good urine output.  Had her first BM today.  Taking short walks.  She is asking about whether or when she can take a shower.  Reassured her she can shower now, pat dry the TegaDerm over her incision.  Call back prn.

## 2011-07-31 ENCOUNTER — Encounter (HOSPITAL_COMMUNITY): Payer: Self-pay | Admitting: Surgery

## 2011-08-20 ENCOUNTER — Encounter (INDEPENDENT_AMBULATORY_CARE_PROVIDER_SITE_OTHER): Payer: Medicare Other | Admitting: Surgery

## 2011-09-03 ENCOUNTER — Ambulatory Visit (INDEPENDENT_AMBULATORY_CARE_PROVIDER_SITE_OTHER): Payer: Self-pay | Admitting: Surgery

## 2011-09-03 ENCOUNTER — Encounter (INDEPENDENT_AMBULATORY_CARE_PROVIDER_SITE_OTHER): Payer: Self-pay | Admitting: Surgery

## 2011-09-03 VITALS — BP 142/90 | HR 86 | Temp 97.0°F | Resp 14 | Ht 68.0 in | Wt 265.5 lb

## 2011-09-03 DIAGNOSIS — Z9889 Other specified postprocedural states: Secondary | ICD-10-CM

## 2011-09-03 DIAGNOSIS — K439 Ventral hernia without obstruction or gangrene: Secondary | ICD-10-CM

## 2011-09-03 NOTE — Patient Instructions (Signed)
You may resume all normal activities and exercise We will see you again on an as needed basis. Please call the office at [phone] if you have any questions or concerns. Thank you for allowing [place] to take care of you.

## 2011-09-03 NOTE — Progress Notes (Signed)
NAME: Alicia Kaufman                                            DOB: 1960-10-29 DATE: 09/03/2011                                                  MRN: [id number]  CC: Post op   HPI: This patient comes in for post op follow-up.Sheunderwent repair of an epigastric hernia on 07/20/2011. She feels that she is doing well.  PE: General: The patient appears to be healthy, NAD Incision healed nicely and no evidence of infection or recurrence of hernia. Still a little firm from usual post o  IMPRESSION: The patient is doing well S/P Epigastric hernia repair.    PLAN: RTCPRN

## 2011-11-14 ENCOUNTER — Telehealth (INDEPENDENT_AMBULATORY_CARE_PROVIDER_SITE_OTHER): Payer: Self-pay | Admitting: General Surgery

## 2011-11-14 NOTE — Telephone Encounter (Signed)
Letter written and at front for pick up.

## 2011-11-14 NOTE — Telephone Encounter (Signed)
Message copied by Liliana Cline on Thu Nov 14, 2011  2:29 PM ------      Message from: Marnette Burgess      Created: Thu Nov 14, 2011  1:55 PM      Contact: [phone]       Patient called regarding a letter she requested from you yesterday, please call.

## 2011-12-19 ENCOUNTER — Telehealth (INDEPENDENT_AMBULATORY_CARE_PROVIDER_SITE_OTHER): Payer: Self-pay | Admitting: General Surgery

## 2011-12-19 NOTE — Telephone Encounter (Signed)
Message copied by Liliana Cline on Thu Dec 19, 2011  9:36 AM ------      Message from: Marnette Burgess      Created: Thu Dec 19, 2011  9:17 AM      Contact: [phone]       Needs a letter for Social Security regarding her sx she had back in March, please call. Pt stated it was urgent.

## 2011-12-19 NOTE — Telephone Encounter (Signed)
Spoke with patient, who wants letter written that says she can not lift due to hernia repair. I made her aware restrictions from [place] for hernia repair is only until 6 weeks after surgery if there are no complications post surgery. Letter written for restrictions 6 weeks post surgery and at front for patient pick up per her request. She will call if needed.

## 2012-07-13 ENCOUNTER — Other Ambulatory Visit: Payer: Self-pay | Admitting: Family Medicine

## 2012-07-13 DIAGNOSIS — Z853 Personal history of malignant neoplasm of breast: Secondary | ICD-10-CM

## 2012-10-21 ENCOUNTER — Other Ambulatory Visit: Payer: Self-pay | Admitting: Family Medicine

## 2012-10-21 ENCOUNTER — Ambulatory Visit
Admission: RE | Admit: 2012-10-21 | Discharge: 2012-10-21 | Disposition: A | Discharge status: Home / Self Care | Payer: BC Managed Care – PPO | Source: Ambulatory Visit | Attending: Family Medicine | Admitting: Family Medicine

## 2012-10-21 DIAGNOSIS — Z853 Personal history of malignant neoplasm of breast: Secondary | ICD-10-CM

## 2012-10-29 ENCOUNTER — Emergency Department (HOSPITAL_COMMUNITY)
Admission: EM | Admit: 2012-10-29 | Discharge: 2012-10-29 | Disposition: A | Discharge status: Home / Self Care | Payer: BC Managed Care – PPO | Attending: Emergency Medicine | Admitting: Emergency Medicine

## 2012-10-29 ENCOUNTER — Encounter (HOSPITAL_COMMUNITY): Payer: Self-pay | Admitting: Emergency Medicine

## 2012-10-29 ENCOUNTER — Emergency Department (HOSPITAL_COMMUNITY): Payer: BC Managed Care – PPO

## 2012-10-29 DIAGNOSIS — Z853 Personal history of malignant neoplasm of breast: Secondary | ICD-10-CM | POA: Insufficient documentation

## 2012-10-29 DIAGNOSIS — Z8719 Personal history of other diseases of the digestive system: Secondary | ICD-10-CM | POA: Insufficient documentation

## 2012-10-29 DIAGNOSIS — R209 Unspecified disturbances of skin sensation: Secondary | ICD-10-CM | POA: Insufficient documentation

## 2012-10-29 DIAGNOSIS — F411 Generalized anxiety disorder: Secondary | ICD-10-CM | POA: Insufficient documentation

## 2012-10-29 DIAGNOSIS — R200 Anesthesia of skin: Secondary | ICD-10-CM

## 2012-10-29 DIAGNOSIS — Z79899 Other long term (current) drug therapy: Secondary | ICD-10-CM | POA: Insufficient documentation

## 2012-10-29 DIAGNOSIS — Z872 Personal history of diseases of the skin and subcutaneous tissue: Secondary | ICD-10-CM | POA: Insufficient documentation

## 2012-10-29 DIAGNOSIS — I10 Essential (primary) hypertension: Secondary | ICD-10-CM | POA: Insufficient documentation

## 2012-10-29 DIAGNOSIS — Z8739 Personal history of other diseases of the musculoskeletal system and connective tissue: Secondary | ICD-10-CM | POA: Insufficient documentation

## 2012-10-29 DIAGNOSIS — D869 Sarcoidosis, unspecified: Secondary | ICD-10-CM | POA: Insufficient documentation

## 2012-10-29 LAB — POCT I-STAT TROPONIN I: Troponin i, poc: 0 ng/mL (ref 0.00–0.08)

## 2012-10-29 LAB — URINALYSIS, ROUTINE W REFLEX MICROSCOPIC
Glucose, UA: NEGATIVE mg/dL
Ketones, ur: NEGATIVE mg/dL
Nitrite: NEGATIVE
Specific Gravity, Urine: 1.015 (ref 1.005–1.030)
pH: 6 (ref 5.0–8.0)

## 2012-10-29 LAB — COMPREHENSIVE METABOLIC PANEL
Albumin: 3.4 g/dL — ABNORMAL LOW (ref 3.5–5.2)
Alkaline Phosphatase: 97 U/L (ref 39–117)
BUN: 12 mg/dL (ref 6–23)
Calcium: 9.1 mg/dL (ref 8.4–10.5)
Potassium: 3.4 mEq/L — ABNORMAL LOW (ref 3.5–5.1)
Sodium: 140 mEq/L (ref 135–145)
Total Protein: 7.5 g/dL (ref 6.0–8.3)

## 2012-10-29 LAB — CBC WITH DIFFERENTIAL/PLATELET
Basophils Absolute: 0 10*3/uL (ref 0.0–0.1)
Eosinophils Absolute: 0.3 10*3/uL (ref 0.0–0.7)
Lymphocytes Relative: 16 % (ref 12–46)
MCH: 24.1 pg — ABNORMAL LOW (ref 26.0–34.0)
MCHC: 33.3 g/dL (ref 30.0–36.0)
Monocytes Absolute: 0.6 10*3/uL (ref 0.1–1.0)
Neutro Abs: 6.2 10*3/uL (ref 1.7–7.7)
Neutrophils Relative %: 73 % (ref 43–77)
RDW: 14.4 % (ref 11.5–15.5)

## 2012-10-29 LAB — URINE MICROSCOPIC-ADD ON

## 2012-10-29 MED ORDER — POTASSIUM CHLORIDE CRYS ER 20 MEQ PO TBCR
40.0000 meq | EXTENDED_RELEASE_TABLET | Freq: Once | ORAL | Status: AC
  Administered 2012-10-29: 40 meq via ORAL
  Filled 2012-10-29: qty 2

## 2012-10-29 NOTE — Progress Notes (Signed)
   CARE MANAGEMENT ED NOTE 10/29/2012  Patient:  LYNNLEIGH, SODEN   Account Number:  [id number]  Date Initiated:  10/29/2012  Documentation initiated by:  Radford Pax  Subjective/Objective Assessment:   Patient presented to ED with numbness to left side of tongue and left cheek.     Subjective/Objective Assessment Detail:     Action/Plan:   Action/Plan Detail:   Anticipated DC Date:  10/29/2012     Status Recommendation to Physician:   Result of Recommendation:    Other ED Services  Consult Working Plan    DC Planning Services  Other  PCP issues    Choice offered to / List presented to:            Status of service:  Completed, signed off  ED Comments:   ED Comments Detail:  Patient listed as not having a pcp.  Patient stated that Dr. Kathleen Argue is her pcp.  No furhter needs at this time.

## 2012-10-29 NOTE — ED Notes (Signed)
PA at bedside.

## 2012-10-29 NOTE — ED Notes (Signed)
States that she had numbness on the left side of her tongue and left cheek. States that she feeling is coming and going. States that at the present she is having numbness.  States that she did not have drooling involved. States that she has been hypertensive.

## 2012-10-29 NOTE — ED Provider Notes (Signed)
History    CSN: [id number] Arrival date & time 10/29/12  1125  First MD Initiated Contact with Patient 10/29/12 1206     Chief Complaint  Patient presents with  . Numbness   (Consider location/radiation/quality/duration/timing/severity/associated sxs/prior Treatment) The history is provided by the patient. No language interpreter was used.  Alicia Kaufman is a 52 y/o F with PMHx of eczema, umbilical hernia with repair, history of breast cancer, sarcoidosis, anxiety, HTN presenting to the ED sudden onset of left sided facial numbness and left sided finger tip tingling that started this morning at approximately 8:00-8:30AM while patient was at rest. Patient reported that the numbness came on suddenly, stated that she experienced left sided facial numbness that affected the left side of her cheek and tongue - describing a pins and needles sensation - stated that she felt tingling in her tips of her left fingers - denied numbness radiation down the left neck and arm. Reported that the initial episode last approximately 2-3 minutes - stated that she drank water and was doing arm exercises. Stated that another episode occurred at 9:30AM, same exact presentation, lasting approximately 1 minute. Patient became anxious and nervous and stated that she came to the ED. Patient reported that she recently had Tdap vaccine in left deltoid - stated that her left deltoid is mildly sore. Patient reported that she recently seen PCP, Dr. Mardelle Matte, last week and blood pressure medication was increased from once a day to two a day. Denied speech difficulty, difficulty swallowing, facial drooping, loss of vision, visual distortions, headache, dizziness, numbness down the left arm, chest pain, shortness of breath, difficulty breathing, head injury, LOC. Denied ever happening before. Denied involvement of lower extremities.  PCP: Dr. Mardelle Matte   Past Medical History  Diagnosis Date  . Eczema   . Umbilical hernia   . History of  breast cancer     left  . Epigastric hernia 07/05/2011  . Arthritis     RT ANKLE  . Sarcoidosis     NO RECENT PROBLEMS  . Anxiety    Past Surgical History  Procedure Laterality Date  . Cesarean section    . Portacath placement      AND REMOVAL  . Epigastric hernia repair  07/22/2011    Procedure: HERNIA REPAIR EPIGASTRIC ADULT;  Surgeon: Currie Paris, MD;  Location: [facility];  Service: General;  Laterality: N/A;   Family History  Problem Relation Age of Onset  . Depression Mother   . Hypertension Mother   . Cancer Father     stomach  . Diabetes Father    History  Substance Use Topics  . Smoking status: Never Smoker   . Smokeless tobacco: Not on file  . Alcohol Use: No   OB History   Grav Para Term Preterm Abortions TAB SAB Ect Mult Living                 Review of Systems  HENT: Negative for ear pain, trouble swallowing, neck pain and tinnitus.   Eyes: Negative for pain and visual disturbance.  Respiratory: Negative for chest tightness and shortness of breath.   Cardiovascular: Negative for chest pain.  Musculoskeletal: Negative for back pain.  Neurological: Positive for numbness. Negative for dizziness, weakness and headaches.  All other systems reviewed and are negative.    Allergies  Review of patient's allergies indicates no known allergies.  Home Medications   Current Outpatient Rx  Name  Route  Sig  Dispense  Refill  .  lisinopril-hydrochlorothiazide (PRINZIDE,ZESTORETIC) 10-12.5 MG per tablet   Oral   Take 1 tablet by mouth daily.         . Multiple Vitamin (MULTIVITAMIN) capsule   Oral   Take 1 capsule by mouth daily.          BP 151/92  Pulse 96  Temp(Src) 98.8 F (37.1 C) (Oral)  Resp 16  SpO2 100%  LMP 07/18/2006 Physical Exam  Nursing note and vitals reviewed. Constitutional: She is oriented to person, place, and time. She appears well-developed and well-nourished. No distress.  HENT:  Head: Normocephalic and atraumatic.   Mouth/Throat: Oropharynx is clear and moist.  Uvula symmetrical elevation  Negative swelling noted to the posterior oropharynx  Eyes: Conjunctivae and EOM are normal. Pupils are equal, round, and reactive to light. Right eye exhibits no discharge. Left eye exhibits no discharge.  Neck: Normal range of motion. Neck supple.  Negative neck stiffness Negative nuchal rigidity Negative pain upon palpation to the cervical spine  Cardiovascular: Normal rate, regular rhythm and normal heart sounds.  Exam reveals no friction rub.   No murmur heard. Pulses:      Radial pulses are 2+ on the right side, and 2+ on the left side.       Dorsalis pedis pulses are 2+ on the right side, and 2+ on the left side.  Pulmonary/Chest: Effort normal. She has no wheezes. She has no rales.  Decreased breath sounds bilaterally to lower lobes bilaterally  Musculoskeletal: Normal range of motion. She exhibits no tenderness.  Full ROM to upper and lower extremities bilaterally Strength 5+/5+ to upper and lower extremities bilaterally  Neurological: She is alert and oriented to person, place, and time. No cranial nerve deficit or sensory deficit. She exhibits normal muscle tone. She displays a negative Romberg sign. Coordination and gait normal. GCS eye subscore is 4. GCS verbal subscore is 5. GCS motor subscore is 6.  Cranial nerves III-XII grossly intact  Sensation intact to upper and lower extremities, bilaterally, with differentiation to sharp and dull touch Negative Romberg Cerebellar functioning adequate  Negative facial asymmetry Eyebrows elevated symmetrically Negative arm drift   Skin: Skin is warm and dry. No rash noted. She is not diaphoretic. No erythema.  Psychiatric: She has a normal mood and affect. Her behavior is normal. Thought content normal.    ED Course  Procedures (including critical care time)  Discussed case with Dr. Clelia Schaumann - recommended basic labs - possible imaging required,  MRI  2:56PM Discussed labs with patient. Discussed going to get CT scan. Denied having episodes of numbness while being in ED setting.    Date: 10/29/2012  Rate: 91  Rhythm: normal sinus rhythm  QRS Axis: normal  Intervals: normal  ST/T Wave abnormalities: normal  Conduction Disutrbances:none  Narrative Interpretation:   Old EKG Reviewed: none available   Labs Reviewed  CBC WITH DIFFERENTIAL - Abnormal; Notable for the following:    RBC 5.89 (*)    MCV 72.3 (*)    MCH 24.1 (*)    All other components within normal limits  COMPREHENSIVE METABOLIC PANEL - Abnormal; Notable for the following:    Potassium 3.4 (*)    Glucose, Bld 114 (*)    Creatinine, Ser 1.12 (*)    Albumin 3.4 (*)    GFR calc non Af Amer 55 (*)    GFR calc Af Amer 64 (*)    All other components within normal limits  URINALYSIS, ROUTINE W REFLEX MICROSCOPIC - Abnormal;  Notable for the following:    Leukocytes, UA SMALL (*)    All other components within normal limits  URINE MICROSCOPIC-ADD ON - Abnormal; Notable for the following:    Squamous Epithelial / LPF FEW (*)    Bacteria, UA FEW (*)    All other components within normal limits  URINE CULTURE  POCT I-STAT TROPONIN I   Ct Head Wo Contrast  10/29/2012   *RADIOLOGY REPORT*  Clinical Data:  Left-sided facial numbness.  History of hypertension.  CT HEAD WITHOUT CONTRAST  Technique:  Contiguous axial images were obtained from the base of the skull through the vertex without contrast  Comparison:  MR head 10/21/2010.  Findings:  The brain has a normal appearance without evidence for hemorrhage, acute infarction, hydrocephalus, or mass lesion.  There is no extra axial fluid collection.  The skull and paranasal sinuses are normal. Compared with prior MR, the appearance is similar.  IMPRESSION: Normal CT of the head without contrast.   Original Report Authenticated By: Davonna Belling, M.D.   1. Left facial numbness   2. HTN (hypertension)   3. Sarcoidosis      MDM  Patient presenting with left sided facial numbness and left finger tip tingling sensation that occurred two times this morning, once at 8:00-8:30AM and again at 9:30AM, lasting no more than 3 minutes. Denied amaurosis fugax, visual distortions, headache, dizziness, head injury, LOC, weakness.  EKG and troponin negative findings. UA negative findings - few bacteria and squamous cells noted, negative clean catch. CBC negative findings. CMP mildly low potassium (3.4) - potassium given. Mildly elevated Cr (1.12). CT head negative for acute findings - negative hemorrhage, acute infarction, hydrocephalus, mass lesion noted. Discussed case and labimaging results with Dr. Clelia Schaumann - cleared patient for discharge. Patient stable, afebrile. Patient denied having any episodes of numbness to the face and finger tips while being in ED setting. Negative acute intracranial findings noted - negative stroke-like symptoms - negative neurological deficits noted to exam - denied recent illness - doubt trigeminal neuralgia. Denied amaurosis fugax, facial drooping - doubt TIA. Suspicion to be possible HTN reaction, definitive etiology unknown. Patient to be discharged. Referred patient to PCP and neurology. Recommended patient to continue taking BP medications as prescribed - discussed with patient refrain from eating foods high in salt content. Discussed with patient to rest and stay hydrated. Discussed with patient to continue to monitor symptoms and if symptoms are to worsen or change to report back to the ED - strict return instructions given. Patient agreed to plan of care, understood, all questions answered.            Raymon Mutton, PA-C 10/29/12 1714  Raymon Mutton, PA-C 10/29/12 1906

## 2012-10-29 NOTE — ED Notes (Signed)
Pt states that she is unable to purchase baby aspirin until July 4th. as directed by PA.  Reported to PA; it is okay to take aspirin when pt is able to purchase it on the July 4th.  Pt spoke with her daughter to arrange a way to purchase the aspirin.

## 2012-10-30 LAB — URINE CULTURE: Culture: NO GROWTH

## 2012-10-30 NOTE — ED Provider Notes (Signed)
Medical screening examination/treatment/procedure(s) were performed by non-physician practitioner and as supervising physician I was immediately available for consultation/collaboration.    Benny Lennert, MD 10/30/12 [phone]

## 2014-03-24 IMAGING — CT CT HEAD W/O CM
2 series · 16 of 30 positions shown, 20 images · non-contrast
Comparison: MR head 10/21/2010.

CLINICAL DATA: Left-sided facial numbness..  History of
hypertension.

CT HEAD WITHOUT CONTRAST
TECHNIQUE: Contiguous axial images were obtained from the base of
the skull through the vertex without contrast

[Series 2: head w/o · axial · non-contrast · 0.45mm/px · z∈[+1459,+1579]mm · 13 of 30 slices shown, 17 images]
[im 3/30  brain]
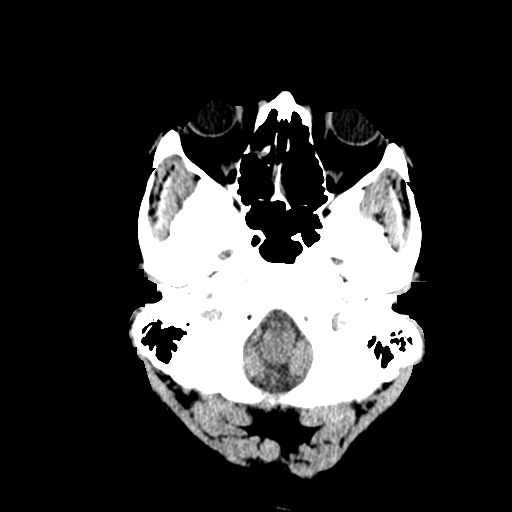
[im 3/30  bone]
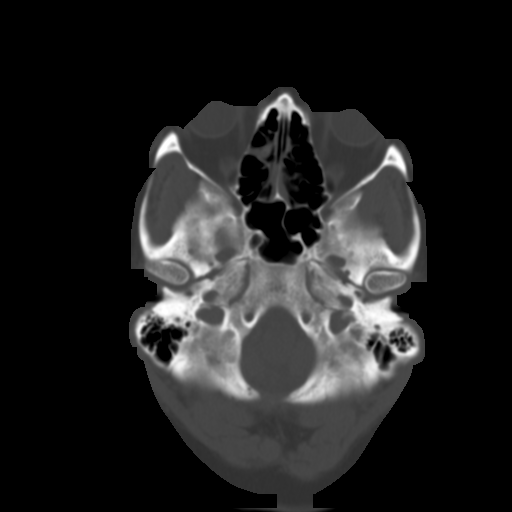
[im 5/30  brain]
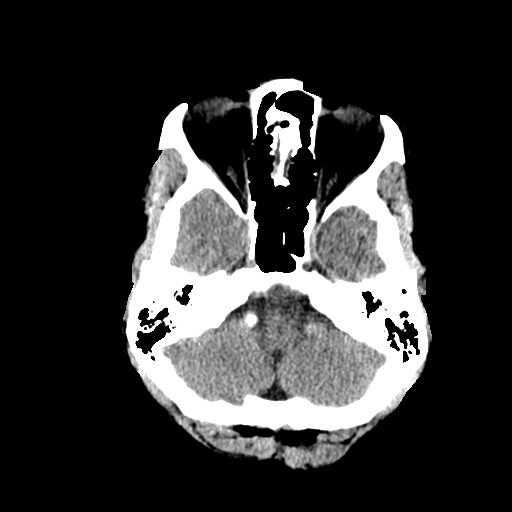
[im 7/30  brain]
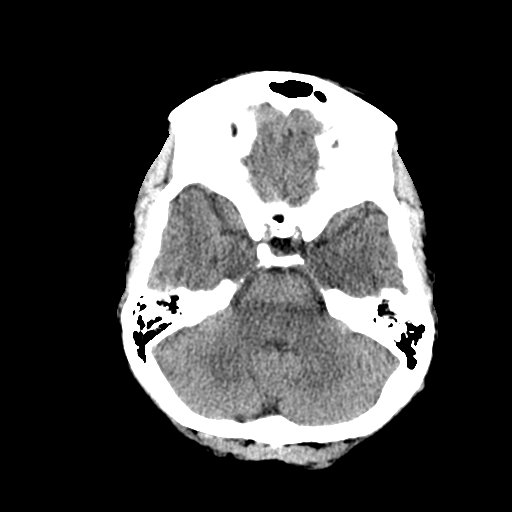
[im 9/30  brain]
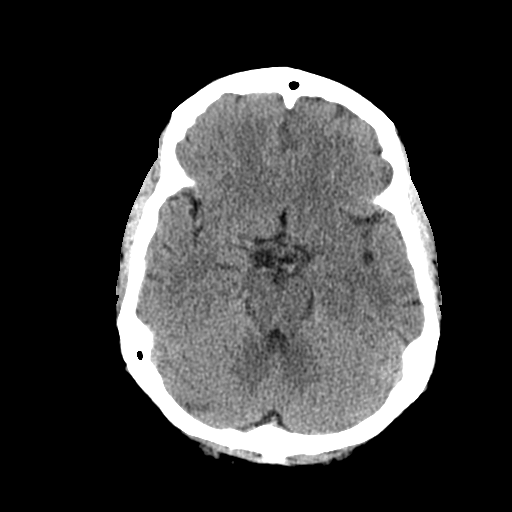
[im 11/30  brain]
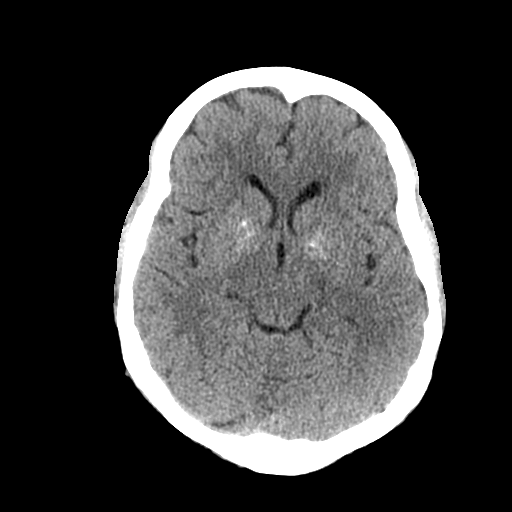
[im 11/30  bone]
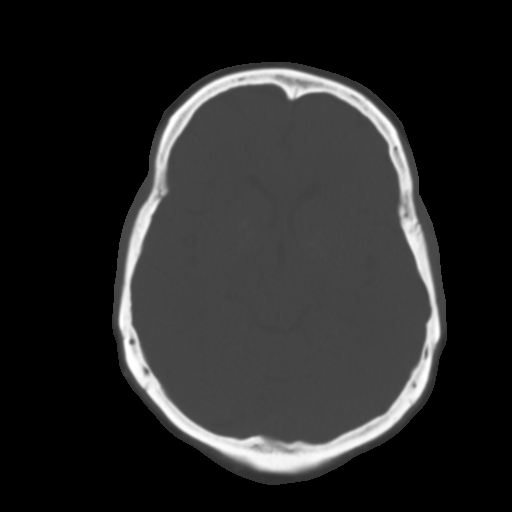
[im 13/30  brain]
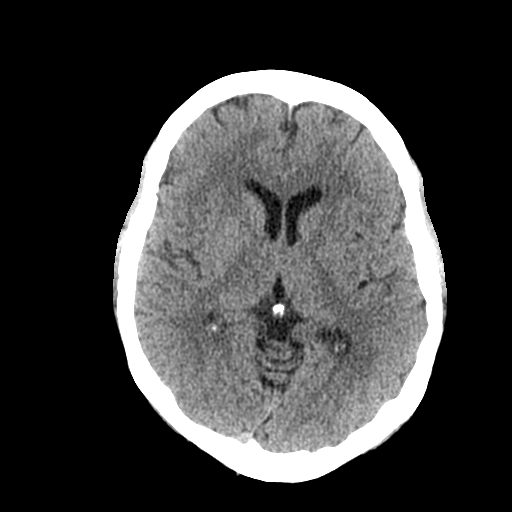
[im 15/30  brain]
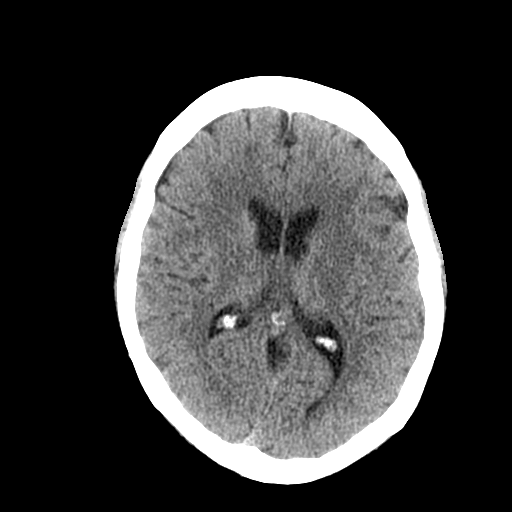
[im 17/30  brain]
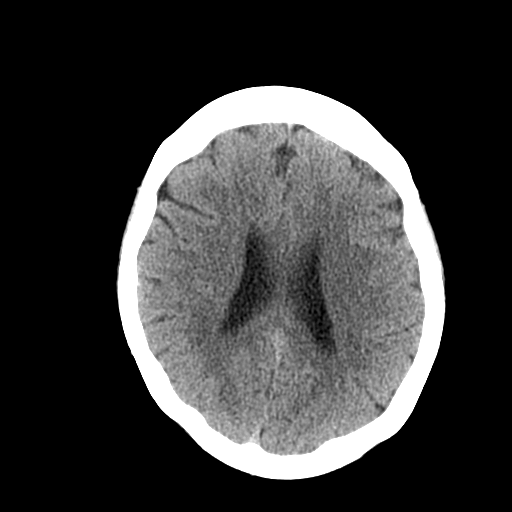
[im 19/30  brain]
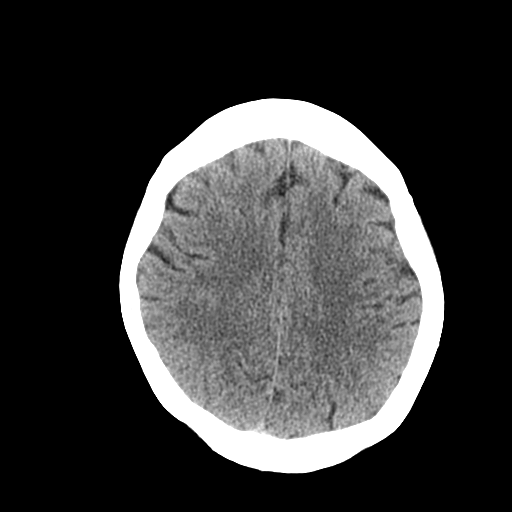
[im 19/30  bone]
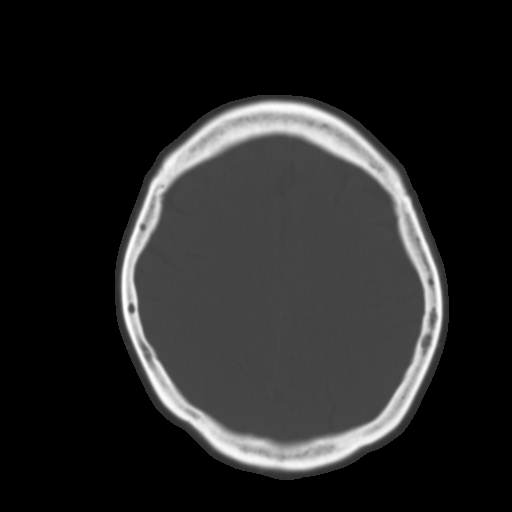
[im 21/30  brain]
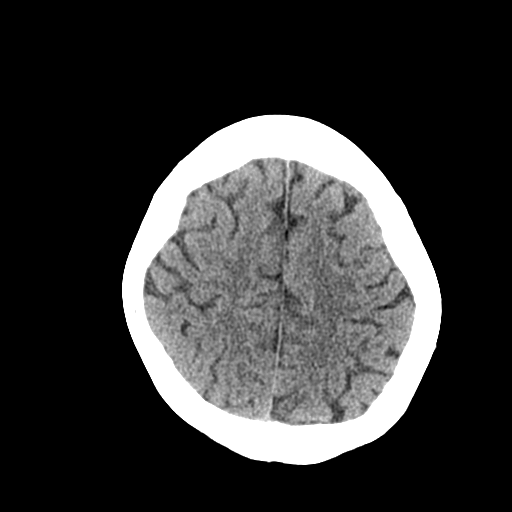
[im 23/30  brain]
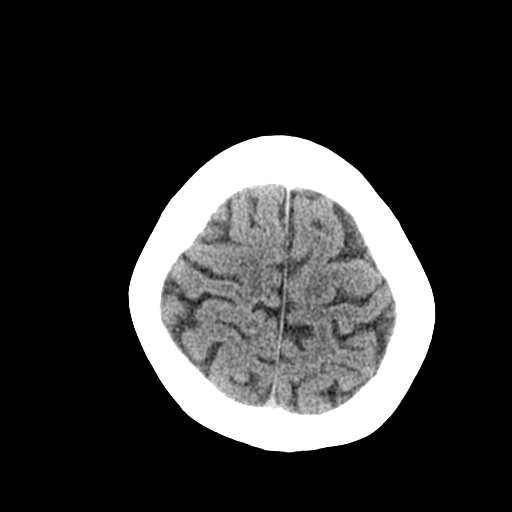
[im 25/30  brain]
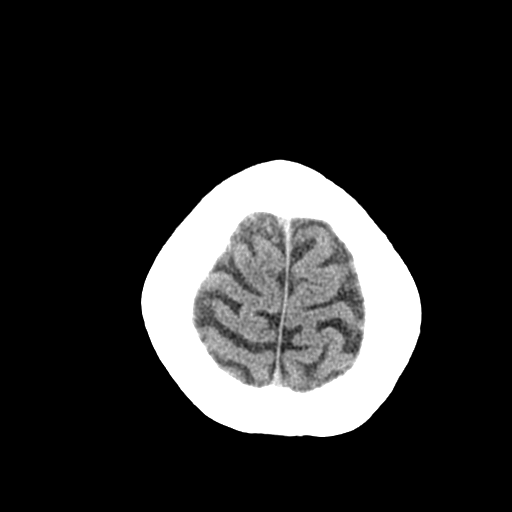
[im 27/30  brain]
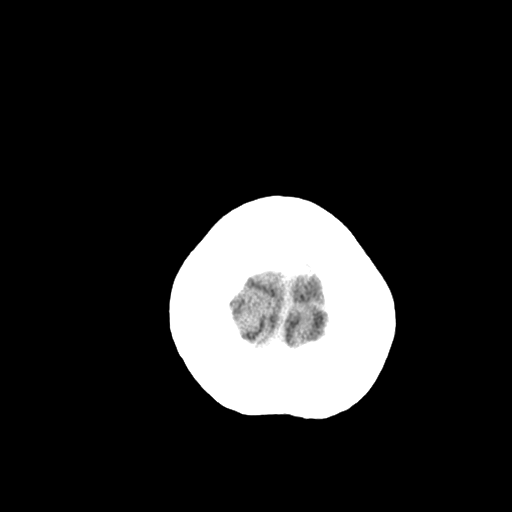
[im 27/30  bone]
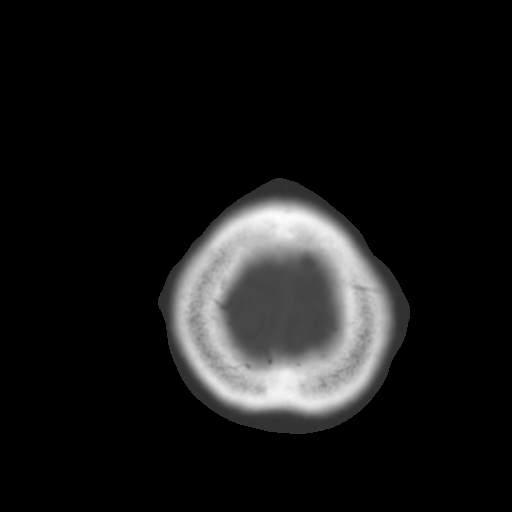

[Series 3: bone windows · axial · 0.45mm/px · z∈[+1459,+1499]mm · 3 of 30 slices shown]
[im 3/30  bone]
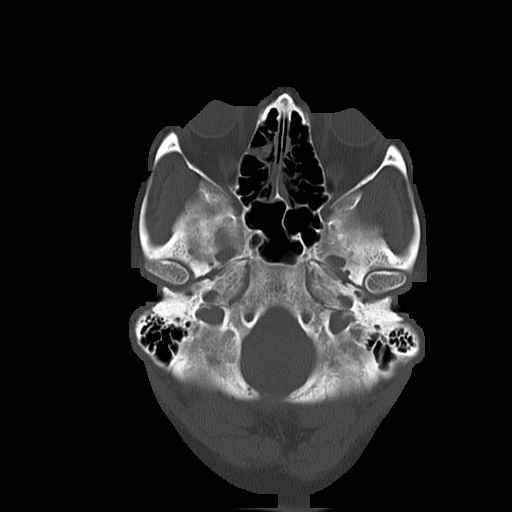
[im 7/30  bone]
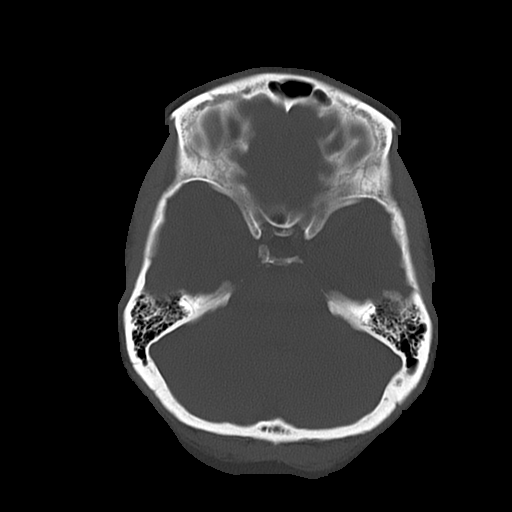
[im 11/30  bone]
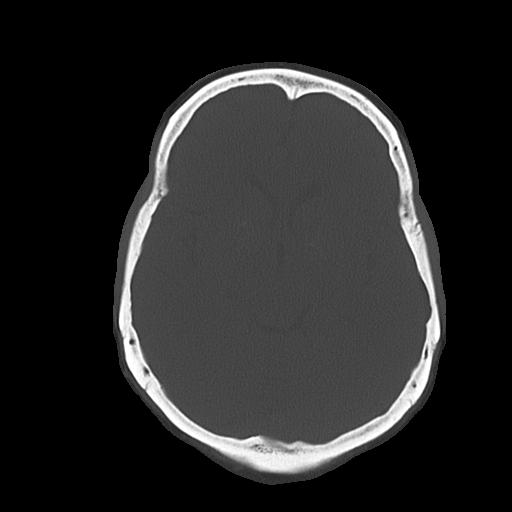

[16 of 30 positions shown; findings below may reference images not displayed]

FINDINGS: The brain has a normal appearance without evidence for
hemorrhage, acute infarction, hydrocephalus, or mass lesion.  There
is no extra axial fluid collection.  The skull and paranasal
sinuses are normal. Compared with prior MR, the appearance is
similar.
IMPRESSION: Normal CT of the head without contrast.

## 2014-10-19 ENCOUNTER — Emergency Department (HOSPITAL_BASED_OUTPATIENT_CLINIC_OR_DEPARTMENT_OTHER): Payer: Self-pay

## 2014-10-19 ENCOUNTER — Emergency Department (HOSPITAL_COMMUNITY)
Admission: EM | Admit: 2014-10-19 | Discharge: 2014-10-19 | Disposition: A | Discharge status: Home / Self Care | Payer: Self-pay | Attending: Emergency Medicine | Admitting: Emergency Medicine

## 2014-10-19 ENCOUNTER — Encounter (HOSPITAL_COMMUNITY): Payer: Self-pay | Admitting: Emergency Medicine

## 2014-10-19 ENCOUNTER — Emergency Department (HOSPITAL_COMMUNITY): Payer: Self-pay

## 2014-10-19 DIAGNOSIS — M79609 Pain in unspecified limb: Secondary | ICD-10-CM

## 2014-10-19 DIAGNOSIS — M25561 Pain in right knee: Secondary | ICD-10-CM | POA: Insufficient documentation

## 2014-10-19 DIAGNOSIS — Z8719 Personal history of other diseases of the digestive system: Secondary | ICD-10-CM | POA: Insufficient documentation

## 2014-10-19 DIAGNOSIS — Z79899 Other long term (current) drug therapy: Secondary | ICD-10-CM | POA: Insufficient documentation

## 2014-10-19 DIAGNOSIS — Z853 Personal history of malignant neoplasm of breast: Secondary | ICD-10-CM | POA: Insufficient documentation

## 2014-10-19 DIAGNOSIS — Z8739 Personal history of other diseases of the musculoskeletal system and connective tissue: Secondary | ICD-10-CM | POA: Insufficient documentation

## 2014-10-19 DIAGNOSIS — F419 Anxiety disorder, unspecified: Secondary | ICD-10-CM | POA: Insufficient documentation

## 2014-10-19 MED ORDER — LOSARTAN POTASSIUM-HCTZ 100-25 MG PO TABS: 1.0000 | ORAL_TABLET | Freq: Every day | ORAL | Status: AC

## 2014-10-19 MED ORDER — LISINOPRIL-HYDROCHLOROTHIAZIDE 10-12.5 MG PO TABS: 1.0000 | ORAL_TABLET | Freq: Every day | ORAL | Status: AC

## 2014-10-19 MED ORDER — DICLOFENAC SODIUM 75 MG PO TBEC: 75.0000 mg | DELAYED_RELEASE_TABLET | Freq: Two times a day (BID) | ORAL | Status: AC | PRN

## 2014-10-19 MED ORDER — AMLODIPINE BESYLATE 10 MG PO TABS: 10.0000 mg | ORAL_TABLET | Freq: Every day | ORAL | Status: AC

## 2014-10-19 NOTE — ED Provider Notes (Signed)
CSN: [id number]     Arrival date & time 10/19/14  1206 History   This chart was scribed for Alicia Pel, PA-C working with Alicia Hong, MD by Alicia Kaufman, ED Scribe. This patient was seen in room WTR7/WTR7 and the patient's care was started at 1:09 PM.   Chief Complaint  Patient presents with  . Knee Pain   The history is provided by the patient. No language interpreter was used.   HPI Comments: Alicia Kaufman is a 54 y.o. female who presents to the Emergency Department complaining of right knee pain and swelling for five weeks. Patient reports awakening to painful, swollen knee in the middle of the night and ongoing difficulty since. Patient reports relief of her pain when swimming/exercising in the pool and walking. Patient reports treatment with Diclofenac, prescribed for previous neck injury. Patient reports throbbing pain in her knee today and swelling in her calf and lower leg extending to her ankle. Patient reports increased activity and work out regimen to lose weight after recent diagnosis of Diabetes. Patient attempting to control her Diabetes with diet and exercise.   Patient denies direct injury or trauma.    Past Medical History  Diagnosis Date  . Eczema   . Umbilical hernia   . History of breast cancer     left  . Epigastric hernia 07/05/2011  . Arthritis     RT ANKLE  . Sarcoidosis     NO RECENT PROBLEMS  . Anxiety    Past Surgical History  Procedure Laterality Date  . Cesarean section    . Portacath placement      AND REMOVAL  . Epigastric hernia repair  07/22/2011    Procedure: HERNIA REPAIR EPIGASTRIC ADULT;  Surgeon: Alicia Paris, MD;  Location: [facility];  Service: General;  Laterality: N/A;   Family History  Problem Relation Age of Onset  . Depression Mother   . Hypertension Mother   . Cancer Father     stomach  . Diabetes Father    History  Substance Use Topics  . Smoking status: Never Smoker   . Smokeless tobacco: Not on file  . Alcohol Use:  No   OB History    No data available     Review of Systems  Constitutional: Negative for fatigue.  Musculoskeletal: Positive for joint swelling and arthralgias. Negative for gait problem.    Allergies  Review of patient's allergies indicates no known allergies.  Home Medications   Prior to Admission medications   Medication Sig Start Date End Date Taking? Authorizing Provider  acetaminophen (TYLENOL) 500 MG tablet Take 1,000 mg by mouth every 6 (six) hours as needed for headache.   Yes Historical Provider, MD  Albuterol Sulfate 108 (90 BASE) MCG/ACT AEPB Inhale 2 puffs into the lungs every 4 (four) hours as needed (shortness of breath).    Yes Historical Provider, MD  amLODipine (NORVASC) 10 MG tablet Take 1 tablet by mouth daily. 10/18/13  Yes Historical Provider, MD  busPIRone (BUSPAR) 7.5 MG tablet Take 7.5-15 mg by mouth daily as needed (deprression).  05/03/14  Yes Historical Provider, MD  ipratropium (ATROVENT) 0.06 % nasal spray Place 2 sprays into the nose 3 (three) times daily as needed. 04/21/14 04/21/15 Yes Historical Provider, MD  lisinopril-hydrochlorothiazide (PRINZIDE,ZESTORETIC) 10-12.5 MG per tablet Take 1 tablet by mouth daily.   Yes Historical Provider, MD  losartan-hydrochlorothiazide (HYZAAR) 100-25 MG per tablet Take 1 tablet by mouth daily. 05/03/14 05/03/15 Yes Historical Provider, MD  metFORMIN (GLUCOPHAGE)  500 MG tablet Take 500 mg by mouth 2 (two) times daily. 05/26/14 05/26/15 Yes Historical Provider, MD  Multiple Vitamin (MULTIVITAMIN) capsule Take 1 capsule by mouth daily.   Yes Historical Provider, MD  diclofenac (VOLTAREN) 75 MG EC tablet Take 1 tablet (75 mg total) by mouth 2 (two) times daily as needed for moderate pain. 10/19/14   Alicia Pel, PA-C   Triage Vitals: BP 187/97 mmHg  Pulse 94  Temp(Src) 98.7 F (37.1 C) (Oral)  Resp 18  SpO2 97%  LMP 07/18/2006 Physical Exam  Constitutional: She is oriented to person, place, and time. She appears  well-developed and well-nourished. No distress.  HENT:  Head: Normocephalic and atraumatic.  Eyes: EOM are normal.  Neck: Neck supple. No tracheal deviation present.  Cardiovascular: Normal rate.   Pulmonary/Chest: Effort normal. No respiratory distress.  Musculoskeletal: Normal range of motion.  Swelling to right knee and lower extremity, symmetrical pedal pulses.  Mild pain to palpation and to ROM of the right knee. Unable to localize pain. No rash or wounds. No erythema, induration or obvious joint effusion  Neurological: She is alert and oriented to person, place, and time.  Skin: Skin is warm and dry.  Psychiatric: She has a normal mood and affect. Her behavior is normal.  Nursing note and vitals reviewed.   ED Course  Procedures (including critical care time)  COORDINATION OF CARE: 1:15 PM- Will order Doppler study to rule out DVT. Discussed treatment plan with patient at bedside and patient agreed to plan.   Labs Review Labs Reviewed - No data to display  Imaging Review Dg Knee Complete 4 Views Right  10/19/2014   CLINICAL DATA:  Worsening right knee pain for 5 weeks.  EXAM: RIGHT KNEE - COMPLETE 4+ VIEW  COMPARISON:  None.  FINDINGS: There is no evidence of fracture, dislocation, or definite/significant joint effusion.  Minimal marginal spurring without joint narrowing. Osteopenic appearance of the bones. Incidental soft tissue calcifications.  IMPRESSION: No acute findings or significant degenerative change.   Electronically Signed   By: Marnee Kaufman AliciaD.   On: 10/19/2014 12:58     EKG Interpretation None      MDM   Final diagnoses:  Right knee pain    She given a knee sleeve and advised to ice down her knee after working out. Also recommended that she continue to swim often since this helps her knee. DVT r/o. BP medications refilled since patient does not have insurance right now and has been out for 1 week.  Medications - No data to display  54 y.o.Alicia  Kaufman's evaluation in the Emergency Department is complete. It has been determined that no acute conditions requiring further emergency intervention are present at this time. The patient/guardian have been advised of the diagnosis and plan. We have discussed signs and symptoms that warrant return to the ED, such as changes or worsening in symptoms.  Vital signs are stable at discharge. Filed Vitals:   10/19/14 1214  BP: 187/97  Pulse: 94  Temp: 98.7 F (37.1 C)  Resp: 18    Patient/guardian has voiced understanding and agreed to follow-up with the PCP or specialist.    I personally performed the services described in this documentation, which was scribed in my presence. The recorded information has been reviewed and is accurate.    Alicia Pel, PA-C 10/19/14 1426  Alicia Hong, MD 10/19/14 [phone]

## 2014-10-19 NOTE — Discharge Instructions (Signed)
Knee Bracing °Knee braces are supports to help stabilize and protect an injured or painful knee. They come in many different styles. They should support and protect the knee without increasing the chance of other injuries to yourself or others. It is important not to have a false sense of security when using a brace. Knee braces that help you to keep using your knee: °· Do not restore normal knee stability under high stress forces. °· May decrease some aspects of athletic performance. °Some of the different types of knee braces are: °· Prophylactic knee braces are designed to prevent or reduce the severity of knee injuries during sports that make injury to the knee more likely. °· Rehabilitative knee braces are designed to allow protected motion of: °¨ Injured knees. °¨ Knees that have been treated with or without surgery. °There is no evidence that the use of a supportive knee brace protects the graft following a successful anterior cruciate ligament (ACL) reconstruction. However, braces are sometimes used to:  °· Protect injured ligaments. °· Control knee movement during the initial healing period. °They may be used as part of the treatment program for the various injured ligaments or cartilage of the knee including the: °· Anterior cruciate ligament. °· Medial collateral ligament. °· Medial or lateral cartilage (meniscus). °· Posterior cruciate ligament. °· Lateral collateral ligament. °Rehabilitative knee braces are most commonly used: °· During crutch-assisted walking right after injury. °· During crutch-assisted walking right after surgery to repair the cartilage and/or cruciate ligament injury. °· For a short period of time, 2-8 weeks, after the injury or surgery. °The value of a rehabilitative brace as opposed to a cast or splint includes the: °· Ability to adjust the brace for swelling. °· Ability to remove the brace for examinations, icing, or showering. °· Ability to allow for movement in a controlled  range of motion. °Functional knee braces give support to knees that have already been injured. They are designed to provide stability for the injured knee and provide protection after repair. Functional knee braces may not affect performance much. Lower extremity muscle strengthening, flexibility, and improvement in technique are more important than bracing in treating ligamentous knee injuries. Functional braces are not a substitute for rehabilitation or surgical procedures. °Unloader/off-loader braces are designed to provide pain relief in arthritic knees. Patients with wear and tear arthritis from growing old or from an old cartilage injury (osteoarthritis) of the knee, and bowlegged (varus) or knock-knee (valgus) deformities, often develop increased pain in the arthritic side due to increased loading. Unloader/off-loader braces are made to reduce uneven loading in such knees. There is reduction in bowing out movement in bowlegged knees when the correct unloader brace is used. Patients with advanced osteoarthritis or severe varus or valgus alignment problems would not likely benefit from bracing. °Patellofemoral braces help the kneecap to move smoothly and well centered over the end of the femur in the knee.  °Most people who wear knee braces feel that they help. However, there is a lack of scientific evidence that knee braces are helpful at the level needed for athletic participation to prevent injury. In spite of this, athletes report an increase in knee stability, pain relief, performance improvement, and confidence during athletics when using a brace.  °Different knee problems require different knee braces: °· Your caregiver may suggest one kind of knee brace after knee surgery. °· A caregiver may choose another kind of knee brace for support instead of surgery for some types of torn ligaments. °· You may   also need one for pain in the front of your knee that is not getting better with strengthening and  flexibility exercises. °Get your caregiver's advice if you want to try a knee brace. The caregiver will advise you on where to get them and provide a prescription when it is needed to fashion and/or fit the brace. °Knee braces are the least important part of preventing knee injuries or getting better following injury. Stretching, strengthening and technique improvement are far more important in caring for and preventing knee injuries. When strengthening your knee, increase your activities a little at a time so as not to develop injuries from overuse. Work out an exercise plan with your caregiver and/or physical therapist to get the best program for you. Do not let a knee brace become a crutch. °Always remember, there are no braces which support the knee as well as your original ligaments and cartilage you were born with. Conditioning, proper warm-up, and stretching remain the most important parts of keeping your knees healthy. °HOW TO USE A KNEE BRACE °· During sports, knee braces should be used as directed by your caregiver. °· Make sure that the hinges are where the knee bends. °· Straps, tapes, or hook-and-loop tapes should be fastened around your leg as instructed. °· You should check the placement of the brace during activities to make sure that it has not moved. Poorly positioned braces can hurt rather than help you. °· To work well, a knee brace should be worn during all activities that put you at risk of knee injury. °· Warm up properly before beginning athletic activities. °HOME CARE INSTRUCTIONS °· Knee braces often get damaged during normal use. Replace worn-out braces for maximum benefit. °· Clean regularly with soap and water. °· Inspect your brace often for wear and tear. °· Cover exposed metal to protect others from injury. °· Durable materials may cost more, but last longer. °SEEK IMMEDIATE MEDICAL CARE IF:  °· Your knee seems to be getting worse rather than better. °· You have increasing pain or  swelling in the knee. °· You have problems caused by the knee brace. °· You have increased swelling or inflammation (redness or soreness) in your knee. °· Your knee becomes warm and more painful and you develop an unexplained temperature over 101°F (38.3°C). °MAKE SURE YOU:  °· Understand these instructions. °· Will watch your condition. °· Will get help right away if you are not doing well or get worse. °See your caregiver, physical therapist, or orthopedic surgeon for additional information. °Document Released: 07/13/2003 Document Revised: 09/06/2013 Document Reviewed: 10/19/2008 °[employer]® Patient Information ©2015 [employer]. This information is not intended to replace advice given to you by your health care provider. Make sure you discuss any questions you have with your health care provider. ° °Knee Pain °The knee is the complex joint between your thigh and your lower leg. It is made up of bones, tendons, ligaments, and cartilage. The bones that make up the knee are: °· The femur in the thigh. °· The tibia and fibula in the lower leg. °· The patella or kneecap riding in the groove on the lower femur. °CAUSES  °Knee pain is a common complaint with many causes. A few of these causes are: °· Injury, such as: °¨ A ruptured ligament or tendon injury. °¨ Torn cartilage. °· Medical conditions, such as: °¨ Gout °¨ Arthritis °¨ Infections °· Overuse, over training, or overdoing a physical activity. °Knee pain can be minor or severe. Knee pain can   accompany debilitating injury. Minor knee problems often respond well to self-care measures or get well on their own. More serious injuries may need medical intervention or even surgery. °SYMPTOMS °The knee is complex. Symptoms of knee problems can vary widely. Some of the problems are: °· Pain with movement and weight bearing. °· Swelling and tenderness. °· Buckling of the knee. °· Inability to straighten or extend your knee. °· Your knee locks and you cannot straighten  it. °· Warmth and redness with pain and fever. °· Deformity or dislocation of the kneecap. °DIAGNOSIS  °Determining what is wrong may be very straight forward such as when there is an injury. It can also be challenging because of the complexity of the knee. Tests to make a diagnosis may include: °· Your caregiver taking a history and doing a physical exam. °· Routine X-rays can be used to rule out other problems. X-rays will not reveal a cartilage tear. Some injuries of the knee can be diagnosed by: °¨ Arthroscopy a surgical technique by which a small video camera is inserted through tiny incisions on the sides of the knee. This procedure is used to examine and repair internal knee joint problems. Tiny instruments can be used during arthroscopy to repair the torn knee cartilage (meniscus). °¨ Arthrography is a radiology technique. A contrast liquid is directly injected into the knee joint. Internal structures of the knee joint then become visible on X-ray film. °¨ An MRI scan is a non X-ray radiology procedure in which magnetic fields and a computer produce two- or three-dimensional images of the inside of the knee. Cartilage tears are often visible using an MRI scanner. MRI scans have largely replaced arthrography in diagnosing cartilage tears of the knee. °· Blood work. °· Examination of the fluid that helps to lubricate the knee joint (synovial fluid). This is done by taking a sample out using a needle and a syringe. °TREATMENT °The treatment of knee problems depends on the cause. Some of these treatments are: °· Depending on the injury, proper casting, splinting, surgery, or physical therapy care will be needed. °· Give yourself adequate recovery time. Do not overuse your joints. If you begin to get sore during workout routines, back off. Slow down or do fewer repetitions. °· For repetitive activities such as cycling or running, maintain your strength and nutrition. °· Alternate muscle groups. For example, if  you are a weight lifter, work the upper body on one day and the lower body the next. °· Either tight or weak muscles do not give the proper support for your knee. Tight or weak muscles do not absorb the stress placed on the knee joint. Keep the muscles surrounding the knee strong. °· Take care of mechanical problems. °¨ If you have flat feet, orthotics or special shoes may help. See your caregiver if you need help. °¨ Arch supports, sometimes with wedges on the inner or outer aspect of the heel, can help. These can shift pressure away from the side of the knee most bothered by osteoarthritis. °¨ A brace called an "unloader" brace also may be used to help ease the pressure on the most arthritic side of the knee. °· If your caregiver has prescribed crutches, braces, wraps or ice, use as directed. The acronym for this is PRICE. This means protection, rest, ice, compression, and elevation. °· Nonsteroidal anti-inflammatory drugs (NSAIDs), can help relieve pain. But if taken immediately after an injury, they may actually increase swelling. Take NSAIDs with food in your stomach. Stop them   if you develop stomach problems. Do not take these if you have a history of ulcers, stomach pain, or bleeding from the bowel. Do not take without your caregiver's approval if you have problems with fluid retention, heart failure, or kidney problems. °· For ongoing knee problems, physical therapy may be helpful. °· Glucosamine and chondroitin are over-the-counter dietary supplements. Both may help relieve the pain of osteoarthritis in the knee. These medicines are different from the usual anti-inflammatory drugs. Glucosamine may decrease the rate of cartilage destruction. °· Injections of a corticosteroid drug into your knee joint may help reduce the symptoms of an arthritis flare-up. They may provide pain relief that lasts a few months. You may have to wait a few months between injections. The injections do have a small increased risk of  infection, water retention, and elevated blood sugar levels. °· Hyaluronic acid injected into damaged joints may ease pain and provide lubrication. These injections may work by reducing inflammation. A series of shots may give relief for as long as 6 months. °· Topical painkillers. Applying certain ointments to your skin may help relieve the pain and stiffness of osteoarthritis. Ask your pharmacist for suggestions. Many over the-counter products are approved for temporary relief of arthritis pain. °· In some countries, doctors often prescribe topical NSAIDs for relief of chronic conditions such as arthritis and tendinitis. A review of treatment with NSAID creams found that they worked as well as oral medications but without the serious side effects. °PREVENTION °· Maintain a healthy weight. Extra pounds put more strain on your joints. °· Get strong, stay limber. Weak muscles are a common cause of knee injuries. Stretching is important. Include flexibility exercises in your workouts. °· Be smart about exercise. If you have osteoarthritis, chronic knee pain or recurring injuries, you may need to change the way you exercise. This does not mean you have to stop being active. If your knees ache after jogging or playing basketball, consider switching to swimming, water aerobics, or other low-impact activities, at least for a few days a week. Sometimes limiting high-impact activities will provide relief. °· Make sure your shoes fit well. Choose footwear that is right for your sport. °· Protect your knees. Use the proper gear for knee-sensitive activities. Use kneepads when playing volleyball or laying carpet. Buckle your seat belt every time you drive. Most shattered kneecaps occur in car accidents. °· Rest when you are tired. °SEEK MEDICAL CARE IF:  °You have knee pain that is continual and does not seem to be getting better.  °SEEK IMMEDIATE MEDICAL CARE IF:  °Your knee joint feels hot to the touch and you have a high  fever. °MAKE SURE YOU:  °· Understand these instructions. °· Will watch your condition. °· Will get help right away if you are not doing well or get worse. °Document Released: 02/17/2007 Document Revised: 07/15/2011 Document Reviewed: 02/17/2007 °[employer]® Patient Information ©2015 [employer]. This information is not intended to replace advice given to you by your health care provider. Make sure you discuss any questions you have with your health care provider. ° °

## 2014-10-19 NOTE — Progress Notes (Signed)
VASCULAR LAB PRELIMINARY  PRELIMINARY  PRELIMINARY  PRELIMINARY  Right lower extremity venous duplex completed.    Preliminary report:  Right:  No evidence of DVT, superficial thrombosis, or Baker's cyst.  Alicia Kaufman, RVS 10/19/2014, 2:16 PM

## 2014-10-19 NOTE — ED Notes (Signed)
Pt c/o rt knee pain x 5 wks.  No injury.  C/o swelling to that knee.

## 2016-10-01 ENCOUNTER — Other Ambulatory Visit: Payer: Self-pay | Admitting: Neurosurgery

## 2016-10-01 DIAGNOSIS — M4722 Other spondylosis with radiculopathy, cervical region: Secondary | ICD-10-CM

## 2016-10-12 ENCOUNTER — Ambulatory Visit
Admission: RE | Admit: 2016-10-12 | Discharge: 2016-10-12 | Disposition: A | Discharge status: Home / Self Care | Payer: Self-pay | Source: Ambulatory Visit | Attending: Neurosurgery | Admitting: Neurosurgery

## 2016-10-12 DIAGNOSIS — M4722 Other spondylosis with radiculopathy, cervical region: Secondary | ICD-10-CM
